# Patient Record
Sex: Male | Born: 1990 | Race: Black or African American | Hispanic: No | Marital: Single | Smoking: Never smoker
Health system: Southern US, Community
[De-identification: ages and names within clinical notes are randomized; demographics above are authoritative.]

## PROBLEM LIST (undated history)

## (undated) DIAGNOSIS — F419 Anxiety disorder, unspecified: Secondary | ICD-10-CM

## (undated) DIAGNOSIS — K219 Gastro-esophageal reflux disease without esophagitis: Secondary | ICD-10-CM

## (undated) DIAGNOSIS — Z789 Other specified health status: Secondary | ICD-10-CM

## (undated) HISTORY — PX: NO PAST SURGERIES: SHX2092

## (undated) HISTORY — DX: Other specified health status: Z78.9

---

## 2020-08-08 ENCOUNTER — Ambulatory Visit: Payer: Self-pay

## 2020-08-08 ENCOUNTER — Ambulatory Visit: Payer: 59 | Attending: Internal Medicine

## 2020-08-08 DIAGNOSIS — Z23 Encounter for immunization: Secondary | ICD-10-CM

## 2020-08-08 NOTE — Progress Notes (Signed)
   Covid-19 Vaccination Clinic  Name:  Terrence Baker    MRN: [id number] DOB: June 13, 1991  08/08/2020  Mr. Terrence Baker was observed post Covid-19 immunization for 15 minutes without incident. He was provided with Vaccine Information Sheet and instruction to access the V-Safe system.   Mr. Terrence Baker was instructed to call 911 with any severe reactions post vaccine: Marland Kitchen Difficulty breathing  . Swelling of face and throat  . A fast heartbeat  . A bad rash all over body  . Dizziness and weakness   Immunizations Administered    Name Date Dose VIS Date Route   Moderna COVID-19 Vaccine 08/08/2020  2:11 PM 0.5 mL 04/23/2020 Intramuscular   Manufacturer: Moderna   Lot: [id number]   NDC: 73428-768-11

## 2020-08-11 ENCOUNTER — Ambulatory Visit (HOSPITAL_COMMUNITY)
Admission: EM | Admit: 2020-08-11 | Discharge: 2020-08-11 | Disposition: A | Discharge status: Home / Self Care | Payer: 59 | Attending: Family Medicine | Admitting: Family Medicine

## 2020-08-11 ENCOUNTER — Other Ambulatory Visit: Payer: Self-pay

## 2020-08-11 ENCOUNTER — Encounter (HOSPITAL_COMMUNITY): Payer: Self-pay

## 2020-08-11 DIAGNOSIS — F411 Generalized anxiety disorder: Secondary | ICD-10-CM | POA: Diagnosis not present

## 2020-08-11 LAB — CBC WITH DIFFERENTIAL/PLATELET
Abs Immature Granulocytes: 0.01 10*3/uL (ref 0.00–0.07)
Basophils Absolute: 0 10*3/uL (ref 0.0–0.1)
Basophils Relative: 0 %
Eosinophils Absolute: 0 10*3/uL (ref 0.0–0.5)
Eosinophils Relative: 1 %
HCT: 48 % (ref 39.0–52.0)
Hemoglobin: 15.4 g/dL (ref 13.0–17.0)
Immature Granulocytes: 0 %
Lymphocytes Relative: 42 %
Lymphs Abs: 2 10*3/uL (ref 0.7–4.0)
MCH: 27.6 pg (ref 26.0–34.0)
MCHC: 32.1 g/dL (ref 30.0–36.0)
MCV: 86.2 fL (ref 80.0–100.0)
Monocytes Absolute: 0.7 10*3/uL (ref 0.1–1.0)
Monocytes Relative: 15 %
Neutro Abs: 2 10*3/uL (ref 1.7–7.7)
Neutrophils Relative %: 42 %
Platelets: 147 10*3/uL — ABNORMAL LOW (ref 150–400)
RBC: 5.57 MIL/uL (ref 4.22–5.81)
RDW: 13.1 % (ref 11.5–15.5)
WBC: 4.7 10*3/uL (ref 4.0–10.5)
nRBC: 0 % (ref 0.0–0.2)

## 2020-08-11 LAB — COMPREHENSIVE METABOLIC PANEL
ALT: 29 U/L (ref 0–44)
AST: 26 U/L (ref 15–41)
Albumin: 4.4 g/dL (ref 3.5–5.0)
Alkaline Phosphatase: 43 U/L (ref 38–126)
Anion gap: 10 (ref 5–15)
BUN: 14 mg/dL (ref 6–20)
CO2: 27 mmol/L (ref 22–32)
Calcium: 9.6 mg/dL (ref 8.9–10.3)
Chloride: 100 mmol/L (ref 98–111)
Creatinine, Ser: 1.16 mg/dL (ref 0.61–1.24)
GFR, Estimated: >60 mL/min (ref >60)
Glucose, Bld: 95 mg/dL (ref 70–99)
Potassium: 4 mmol/L (ref 3.5–5.1)
Sodium: 137 mmol/L (ref 135–145)
Total Bilirubin: 0.4 mg/dL (ref 0.3–1.2)
Total Protein: 7.8 g/dL (ref 6.5–8.1)

## 2020-08-11 LAB — TSH: TSH: 1.78 u[IU]/mL (ref 0.350–4.500)

## 2020-08-11 MED ORDER — HYDROXYZINE HCL 25 MG PO TABS: 25.0000 mg | ORAL_TABLET | Freq: Three times a day (TID) | ORAL | 0 refills | Status: DC | PRN

## 2020-08-11 NOTE — ED Provider Notes (Signed)
[facility]    CSN: [id number] Arrival date & time: 08/11/20  1751      History   Chief Complaint Chief Complaint  Patient presents with  . Panic Attack    HPI Terrence Baker is a 30 y.o. male.   Here today with several months of ongoing intermittent episodes of anxiousness, blurry vision, heavy breathing that he states last about 30 minutes or so and happen every 2 weeks or so. He feels initially this was triggered by COVID/vaccine concerns and that he has a lot of stressors currently. Able to go exercise or do deep breathing and calm himself down with full resolution of sxs. Denies CP, hx of cardiac issues, drug use, known history of psychiatric issues, SI/HI.     History reviewed. No pertinent past medical history.  There are no problems to display for this patient.   History reviewed. No pertinent surgical history.     Home Medications    Prior to Admission medications   Medication Sig Start Date End Date Taking? Authorizing Provider  hydrOXYzine (ATARAX/VISTARIL) 25 MG tablet Take 1 tablet (25 mg total) by mouth every 8 (eight) hours as needed. May cause drowsiness 08/11/20  Yes Particia Nearing, PA-C    Family History Family History  Problem Relation Age of Onset  . Diabetes Father     Social History Social History   Tobacco Use  . Smoking status: Never Smoker  . Smokeless tobacco: Never Used  Vaping Use  . Vaping Use: Never used  Substance Use Topics  . Alcohol use: Never  . Drug use: Never     Allergies   Patient has no known allergies.   Review of Systems Review of Systems PER HPI   Physical Exam Triage Vital Signs ED Triage Vitals  Enc Vitals Group     BP 08/11/20 1859 122/77     Pulse Rate 08/11/20 1859 89     Resp 08/11/20 1859 20     Temp 08/11/20 1859 98.9 F (37.2 C)     Temp Source 08/11/20 1859 Oral     SpO2 08/11/20 1859 100 %     Weight --      Height --      Head Circumference --      Peak Flow --       Pain Score 08/11/20 1855 0     Pain Loc --      Pain Edu? --      Excl. in GC? --    No data found.  Updated Vital Signs BP 122/77 (BP Location: Right Arm)   Pulse 89   Temp 98.9 F (37.2 C) (Oral)   Resp 20   SpO2 100%   Visual Acuity Right Eye Distance:   Left Eye Distance:   Bilateral Distance:    Right Eye Near:   Left Eye Near:    Bilateral Near:     Physical Exam Vitals and nursing note reviewed.  Constitutional:      General: He is not in acute distress.    Appearance: Normal appearance. He is not ill-appearing.  HENT:     Head: Atraumatic.     Mouth/Throat:     Mouth: Mucous membranes are moist.     Pharynx: Oropharynx is clear.  Eyes:     Extraocular Movements: Extraocular movements intact.     Conjunctiva/sclera: Conjunctivae normal.  Cardiovascular:     Rate and Rhythm: Normal rate and regular rhythm.     Heart sounds:  Normal heart sounds.  Pulmonary:     Effort: Pulmonary effort is normal. No respiratory distress.     Breath sounds: Normal breath sounds. No wheezing or rales.  Abdominal:     General: Bowel sounds are normal. There is no distension.     Palpations: Abdomen is soft.     Tenderness: There is no abdominal tenderness. There is no guarding.  Musculoskeletal:        General: Normal range of motion.     Cervical back: Normal range of motion and neck supple.  Skin:    General: Skin is warm and dry.  Neurological:     General: No focal deficit present.     Mental Status: He is oriented to person, place, and time.  Psychiatric:        Mood and Affect: Mood normal.        Thought Content: Thought content normal.        Judgment: Judgment normal.      UC Treatments / Results  Labs (all labs ordered are listed, but only abnormal results are displayed) Labs Reviewed  CBC WITH DIFFERENTIAL/PLATELET  COMPREHENSIVE METABOLIC PANEL  TSH    EKG   Radiology No results found.  Procedures Procedures (including critical care  time)  Medications Ordered in UC Medications - No data to display  Initial Impression / Assessment and Plan / UC Course  I have reviewed the triage vital signs and the nursing notes.  Pertinent labs & imaging results that were available during my care of the patient were reviewed by me and considered in my medical decision making (see chart for details).    Well appearing, vital signs stable, exam benign and reassuring. EKG NSR without abnormality, labs pending. Sxs very consistent with panic episodes - treat with prn hydroxyzine, supportive measures to help calm down, and f/u with a PCP and or counselor for ongoing follow up and care. Strict return precautions reviewed.   Final Clinical Impressions(s) / UC Diagnoses   Final diagnoses:  Anxiety state   Discharge Instructions   None    ED Prescriptions    Medication Sig Dispense Auth. Provider   hydrOXYzine (ATARAX/VISTARIL) 25 MG tablet Take 1 tablet (25 mg total) by mouth every 8 (eight) hours as needed. May cause drowsiness 30 tablet Particia Nearing, [place]     PDMP not reviewed this encounter.   Particia Nearing, [place] 08/11/20 1949

## 2020-08-11 NOTE — ED Triage Notes (Addendum)
Pt c/o intermittent "panic anxiety attacks" for approx 3 months in which he states his heart starts racing, his vision "changes like I'm seeing things and it's blurry".  Last episode yesterday and he called EMS, and reports that EMS evaluated his glucose, vitals and left scene w/o transport.  Denies CP, SOB, n/v, pain to back/jaw/arms, diaphoresis. Pt states he is able to sometimes "calm himself down".  Denies drug or ETOH use. 'Tried CBD" approx 1 year ago, no use since

## 2020-08-22 ENCOUNTER — Other Ambulatory Visit: Payer: Self-pay

## 2020-08-22 ENCOUNTER — Telehealth (INDEPENDENT_AMBULATORY_CARE_PROVIDER_SITE_OTHER): Payer: 59 | Admitting: Family

## 2020-08-22 ENCOUNTER — Encounter: Payer: Self-pay | Admitting: Family

## 2020-08-22 DIAGNOSIS — Z7689 Persons encountering health services in other specified circumstances: Secondary | ICD-10-CM | POA: Diagnosis not present

## 2020-08-22 DIAGNOSIS — F411 Generalized anxiety disorder: Secondary | ICD-10-CM
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
## 2020-08-22 NOTE — Progress Notes (Signed)
Establish care

## 2020-08-28 ENCOUNTER — Other Ambulatory Visit: Payer: Self-pay

## 2020-08-28 ENCOUNTER — Ambulatory Visit: Payer: 59 | Admitting: Licensed Clinical Social Worker

## 2020-08-28 ENCOUNTER — Ambulatory Visit (HOSPITAL_COMMUNITY)
Admission: EM | Admit: 2020-08-28 | Discharge: 2020-08-28 | Disposition: A | Discharge status: Home / Self Care | Payer: 59

## 2020-08-28 DIAGNOSIS — F411 Generalized anxiety disorder: Secondary | ICD-10-CM

## 2020-08-28 DIAGNOSIS — F4323 Adjustment disorder with mixed anxiety and depressed mood: Secondary | ICD-10-CM

## 2020-08-28 DIAGNOSIS — F410 Panic disorder [episodic paroxysmal anxiety] without agoraphobia: Secondary | ICD-10-CM

## 2020-08-28 NOTE — ED Notes (Signed)
Patient A&O x 4, ambulatory. Patient discharged in no acute distress. Patient denied SI/HI, A/VH upon discharge. Patient verbalized understanding of all discharge instructions explained by staff, to include follow up appointments, RX's and safety plan. Patient escorted to lobby via staff for transport to destination. Safety maintained.  

## 2020-08-28 NOTE — BH Specialist Note (Addendum)
Integrated Behavioral Health Initial In-Person Visit  MRN: [id number] Name: Terrence Baker  Number of Integrated Behavioral Health Clinician visits:: 1/6 Session Start time: 2:05  Session End time: 3:25 Total time: 80 minutes  Types of Service: Individual psychotherapy and Collaborative care  Interpretor:No.   Subjective: Terrence Baker is a 30 y.o. male accompanied by Mother over the phone. Patient was referred by Dr. Zonia Kief for anxiety and depression. Patient reports the following symptoms/concerns: having frequent anxiety attacks characterized by heavy breathing, racing thoughts, and feelings of having a heart attack. Pt also reports frequent thoughts of self-harm. Duration of problem: ongoing; Severity of problem: severe  Objective: Mood: Anxious and Depressed and Affect: Appropriate and Depressed Risk of harm to self or others: Suicidal ideation addressed by sending pt to urgent care [facility].  Life Context: Family and Social: pt reports getting support from friends, his mother, and his aunt. 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 Self-Care: pt reports spending time with friends to distract himself. Life Changes: since getting this new job, pt reports heightened stress and the occurrence of panic attacks. Since Valentine's weekend, this has worsened and turned into thoughts of self harm.   Patient Strengths/Protective Factors: Social connections, Social and Emotional competence and Concrete supports in place (healthy food, safe environments, etc.)  Goals Addressed: Patient will: 1. Reduce symptoms of: anxiety, depression and mood instability  though intensive therapy and medication management. 2. Increase knowledge and/or ability of: coping skills and healthy habits through practicing anti-anxiety breathing and learning techniques.  Progress towards Goals: Ongoing  Interventions: Interventions utilized:  Motivational Interviewing, Solution-Focused Strategies and Supportive Counseling   Patient and/or Family Response: pt and mother decided they needed immediate care due to urgency of pt's state, and sought out [facility] services.  Patient Centered Plan: Patient is on the following Treatment Plan(s): get assessed for medication and symptoms at [facility], then establish for therapy and psychiatry.  Assessment: Patient currently experiencing high levels of stress, anxiety, and depression stemming from life changes and past trauma. These stressors have caused the pt to experience thoughts of self-harm and have panic attacks.   Patient may benefit from psychiatry to get on a SSRI or other beneficial medication, as well as establishing with a regular counselor.  Plan: 1. Follow up with behavioral health clinician on : the following week. 2. Behavioral recommendations: seek out urgent care. 3. Referral(s): Paramedic (LME/Outside Clinic), Psychiatrist and Counselor  Aldine Contes MSW Intern

## 2020-08-28 NOTE — Discharge Instructions (Signed)

## 2020-08-28 NOTE — ED Provider Notes (Signed)
Behavioral Health Urgent Care Medical Screening Exam  Patient Name: Terrence Baker MRN: [id number] Date of Evaluation: 08/28/20 Chief Complaint:   Diagnosis:  Final diagnoses:  Generalized anxiety disorder    History of Present illness: Terrence Baker is a 30 y.o. male.  Patient presents voluntarily to [facility] behavioral health center for walk-in assessment.  Patient reports anxiety for many years.  Patient reports most recently in January he accepted a new position within his company and has felt increased anxiety since that time.  Patient reports he went to work today and is currently on his lunch break.  Patient reports he has transition back into to his previous position but continues to feel anxiety when at work.  Patient reports a vague, passive suicidal ideation x2 weeks.  Patient denies any plan or intent to harm self.  Patient denies suicidal ideation currently.  Patient denies any history of suicide attempts, denies any history of self-harm behaviors.  Patient contracts verbally for safety with this Clinical research associate.  Patient denies current outpatient psychiatry follow-up.  Patient reports he recently graduated from [employer].  Patient reports he experienced anxiety while in college and saw therapist during that time.  Patient reports he was recently seen by urgent care and prescribed Vistaril 25 mg 3 times daily as needed for anxiety, patient reports this does seem effective.  Patient reports he is interested in establishing care with outpatient psychiatry and counseling.  Patient resides in [place] with a roommate.  Patient denies access to weapons.  Patient is employed with a Magazine features editor.  Patient endorses 8 hours of sleep per night.  Patient endorses average appetite.  Patient denies alcohol and substance use.  Patient assessed by nurse practitioner.  Patient alert and oriented, pleasant and cooperative during assessment.  Patient denies homicidal  ideations.  Patient denies both auditory and visual hallucinations.  Patient denies symptoms of paranoia.  Patient offered support and encouragement.  TTS counselor also spoke with patient's mother, no safety concerns reported.  Psychiatric Specialty Exam  Presentation  General Appearance:Appropriate for Environment; Casual  Eye Contact:Good  Speech:Clear and Coherent; Normal Rate  Speech Volume:Normal  Handedness:Right   Mood and Affect  Mood:Anxious  Affect:Appropriate; Congruent   Thought Process  Thought Processes:Coherent; Goal Directed  Descriptions of Associations:Intact  Orientation:Full (Time, Place and Person)  Thought Content:Logical; WDL  Hallucinations:None  Ideas of Reference:None  Suicidal Thoughts:No  Homicidal Thoughts:No   Sensorium  Memory:Immediate Good; Recent Good; Remote Good  Judgment:Good  Insight:Good   Executive Functions  Concentration:Good  Attention Span:Good  Recall:Good  Fund of Knowledge:Good  Language:Good   Psychomotor Activity  Psychomotor Activity:Normal   Assets  Assets:Communication Skills; Desire for Improvement; Financial Resources/Insurance; Housing; Intimacy; Leisure Time; Physical Health; Resilience; Social Support; Talents/Skills; Transportation   Sleep  Sleep:Good  Number of hours: No data recorded  Physical Exam: Physical Exam Vitals and nursing note reviewed.  Constitutional:      Appearance: He is well-developed.  HENT:     Head: Normocephalic.  Cardiovascular:     Rate and Rhythm: Normal rate.  Pulmonary:     Effort: Pulmonary effort is normal.  Neurological:     Mental Status: He is alert and oriented to person, place, and time.  Psychiatric:        Attention and Perception: Attention and perception normal.        Mood and Affect: Affect normal. Mood is anxious.        Speech: Speech normal.  Behavior: Behavior normal. Behavior is cooperative.        Thought Content:  Thought content normal.        Cognition and Memory: Cognition and memory normal.        Judgment: Judgment normal.    Review of Systems  Constitutional: Negative.   HENT: Negative.   Eyes: Negative.   Respiratory: Negative.   Cardiovascular: Negative.   Gastrointestinal: Negative.   Genitourinary: Negative.   Musculoskeletal: Negative.   Skin: Negative.   Neurological: Negative.   Endo/Heme/Allergies: Negative.   Psychiatric/Behavioral: The patient is nervous/anxious.    Blood pressure 139/89, pulse (!) 112, temperature 98 F (36.7 C), temperature source Oral, resp. rate 18, height 5\' 9"  (1.753 m), weight 166 lb (75.3 kg), SpO2 98 %. Body mass index is 24.51 kg/m.  Musculoskeletal: Strength & Muscle Tone: within normal limits Gait & Station: normal Patient leans: N/A   [facility] MSE Discharge Disposition for Follow up and Recommendations: Based on my evaluation the patient does not appear to have an emergency medical condition and can be discharged with resources and follow up care in outpatient services for Medication Management and Individual Therapy  Patient reviewed with Dr. . Follow-up with outpatient psychiatry and counseling resources provided.   Bronwen Betters, FNP 08/28/2020, 5:10 PM

## 2020-08-29 ENCOUNTER — Telehealth: Payer: Self-pay | Admitting: Family

## 2020-08-29 NOTE — Telephone Encounter (Signed)
new medication will be reviewed with pt at appt on 09/03/2020

## 2020-08-29 NOTE — Telephone Encounter (Signed)
Pt called requesting to start new medication Lexapro per advice of BH visit yesterday. Pt states he has scheduled appt with psychiatry 10/08/20 & appt with counseling 09/10/20.

## 2020-09-03 ENCOUNTER — Encounter: Payer: 59 | Admitting: Family

## 2020-09-03 NOTE — Progress Notes (Signed)
Patient did not show for appointment.   

## 2020-09-04 ENCOUNTER — Ambulatory Visit: Payer: 59 | Admitting: Licensed Clinical Social Worker

## 2020-09-04 ENCOUNTER — Telehealth: Payer: Self-pay | Admitting: Licensed Clinical Social Worker

## 2020-09-04 NOTE — Telephone Encounter (Signed)
MSW Intern called to speak with pt about upcoming appt and pt requested to reschedule appt with SW services for next week, and to reschedule his missed appt with his provider Ricky Stabs from yesterday. MSW Intern let front desk staff know he wished to reschedule with Amy.

## 2020-09-05 ENCOUNTER — Ambulatory Visit: Payer: Self-pay

## 2020-09-10 ENCOUNTER — Ambulatory Visit (INDEPENDENT_AMBULATORY_CARE_PROVIDER_SITE_OTHER): Payer: No Payment, Other | Admitting: Professional

## 2020-09-10 ENCOUNTER — Other Ambulatory Visit: Payer: Self-pay

## 2020-09-10 DIAGNOSIS — F321 Major depressive disorder, single episode, moderate: Secondary | ICD-10-CM

## 2020-09-10 DIAGNOSIS — F411 Generalized anxiety disorder: Secondary | ICD-10-CM | POA: Diagnosis not present
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
## 2020-09-11 ENCOUNTER — Ambulatory Visit: Payer: Self-pay | Admitting: Licensed Clinical Social Worker

## 2020-09-11 ENCOUNTER — Other Ambulatory Visit: Payer: Self-pay

## 2020-09-11 DIAGNOSIS — F411 Generalized anxiety disorder: Secondary | ICD-10-CM

## 2020-09-11 NOTE — BH Specialist Note (Signed)
MSW intern called pt for scheduled appt to see if he was comfortable in his new established care at [facility]. Pt stated that he had his first therapy appt yesterday and liked it, but would prefer an African American therapist. MSW Intern called [facility] to switch his therapist, but when pt found out that the next available African American therapist appt was at the end of April, pt preferred to keep current upcoming appt, and also attend the upcoming psychiatry appt in early April. Pt was informed that MSW Intern would be leaving agency soon, but said they could be contacted for assistance until then.

## 2020-09-22 ENCOUNTER — Encounter: Payer: 59 | Admitting: Family

## 2020-09-22 NOTE — Progress Notes (Signed)
Patient did not show for appointment.   

## 2020-09-24 ENCOUNTER — Ambulatory Visit (HOSPITAL_COMMUNITY): Payer: No Payment, Other | Admitting: Professional

## 2020-09-24 ENCOUNTER — Other Ambulatory Visit: Payer: Self-pay

## 2020-10-07 ENCOUNTER — Other Ambulatory Visit: Payer: Self-pay

## 2020-10-07 ENCOUNTER — Ambulatory Visit (INDEPENDENT_AMBULATORY_CARE_PROVIDER_SITE_OTHER): Payer: No Payment, Other | Admitting: Professional

## 2020-10-07 DIAGNOSIS — F411 Generalized anxiety disorder: Secondary | ICD-10-CM | POA: Diagnosis not present

## 2020-10-07 DIAGNOSIS — F321 Major depressive disorder, single episode, moderate: Secondary | ICD-10-CM

## 2020-10-07 NOTE — Progress Notes (Signed)
Virtual Visit via Telephone Note  I connected with Terrence Baker on 10/07/20 at  3:00 PM EDT by telephone and verified that I am speaking with the correct person using two identifiers.  Location: Patient: Paramedic (private space) Provider: Clinical Home Office   I discussed the limitations, risks, security and privacy concerns of performing an evaluation and management service by telephone and the availability of in person appointments. I also discussed with the patient that there may be a patient responsible charge related to this service. The patient expressed understanding and agreed to proceed.  Follow Up Instructions:    I discussed the assessment and treatment plan with the patient. The patient was provided an opportunity to ask questions and all were answered. The patient agreed with the plan and demonstrated an understanding of the instructions.   The patient was advised to call back or seek an in-person evaluation if the symptoms worsen or if the condition fails to improve as anticipated.  I provided 45 minutes of non-face-to-face time during this encounter.   [name], [facility]    THERAPIST PROGRESS NOTE  Session Time: 3p  Participation Level: Active  Behavioral Response: CasualAlertAnxious  Type of Therapy: Individual Therapy  Treatment Goals addressed: Coping  Interventions: CBT, DBT, Solution Focused, Strength-based, Supportive and Reframing  Summary: Terrence Baker is a 30 y.o. male who presents with anxiety and depression symptoms. Pt and cln unable to get audio to work on [employer] so switched to phone.  Pt reports via phone due to Caregility not working. Pt reports "It's been going. It was a rough winter." Pt reports "some days I'm up, some days I'm down." Pt reports "I was doing good the last couple of weeks." Pt reports he understands the connection between eating a lot of candy/sugar and increased anxiety/depression for himself. Pt reports he had a panic  attack with heart issues and neck pain yesterday. Pt reports "I'm getting massive brain fog." Cln and pt spend time discussing and playing grounding (5-4-3-2-1; categories game) and PMR. Pt denies SI/HI/AVH  Suicidal/Homicidal: Nowithout intent/plan  Therapist Response: Cln asked how the client has been since last seen. Cln asked open ended questions about positive and/or negative changes that have occurred since last seen. Cln used active listening to understand and validate patient. Cln used CBT to discuss thought stopping and reframing. Cln discussed grounding techniques. Cln assisted with scheduling next appointment.  Plan: Return again in 2 weeks.  Diagnosis: MDD; GAD   [name], [facility] 10/07/2020

## 2020-10-08 ENCOUNTER — Encounter: Payer: Self-pay | Admitting: Family

## 2020-10-08 ENCOUNTER — Other Ambulatory Visit: Payer: Self-pay

## 2020-10-08 ENCOUNTER — Ambulatory Visit (INDEPENDENT_AMBULATORY_CARE_PROVIDER_SITE_OTHER): Payer: Self-pay | Admitting: Family

## 2020-10-08 ENCOUNTER — Telehealth (INDEPENDENT_AMBULATORY_CARE_PROVIDER_SITE_OTHER): Payer: No Payment, Other | Admitting: Psychiatry

## 2020-10-08 ENCOUNTER — Encounter (HOSPITAL_COMMUNITY): Payer: Self-pay | Admitting: Psychiatry

## 2020-10-08 VITALS — BP 114/73 | HR 72 | Ht 68.23 in | Wt 161.6 lb

## 2020-10-08 DIAGNOSIS — R0683 Snoring: Secondary | ICD-10-CM

## 2020-10-08 DIAGNOSIS — Z1329 Encounter for screening for other suspected endocrine disorder: Secondary | ICD-10-CM

## 2020-10-08 DIAGNOSIS — Z13220 Encounter for screening for lipoid disorders: Secondary | ICD-10-CM

## 2020-10-08 DIAGNOSIS — Z130 Encounter for screening for diseases of the blood and blood-forming organs and certain disorders involving the immune mechanism: Secondary | ICD-10-CM

## 2020-10-08 DIAGNOSIS — F332 Major depressive disorder, recurrent severe without psychotic features: Secondary | ICD-10-CM

## 2020-10-08 DIAGNOSIS — Z1159 Encounter for screening for other viral diseases: Secondary | ICD-10-CM

## 2020-10-08 DIAGNOSIS — F321 Major depressive disorder, single episode, moderate: Secondary | ICD-10-CM

## 2020-10-08 DIAGNOSIS — Z131 Encounter for screening for diabetes mellitus: Secondary | ICD-10-CM

## 2020-10-08 DIAGNOSIS — R079 Chest pain, unspecified: Secondary | ICD-10-CM

## 2020-10-08 DIAGNOSIS — Z13228 Encounter for screening for other metabolic disorders: Secondary | ICD-10-CM

## 2020-10-08 DIAGNOSIS — Z0000 Encounter for general adult medical examination without abnormal findings: Secondary | ICD-10-CM

## 2020-10-08 DIAGNOSIS — Z114 Encounter for screening for human immunodeficiency virus [HIV]: Secondary | ICD-10-CM

## 2020-10-08 DIAGNOSIS — F411 Generalized anxiety disorder: Secondary | ICD-10-CM | POA: Diagnosis not present

## 2020-10-08 DIAGNOSIS — K219 Gastro-esophageal reflux disease without esophagitis: Secondary | ICD-10-CM

## 2020-10-08 MED ORDER — OMEPRAZOLE 20 MG PO CPDR: 20.0000 mg | DELAYED_RELEASE_CAPSULE | Freq: Every day | ORAL | 2 refills | Status: DC

## 2020-10-08 MED ORDER — TRAZODONE HCL 50 MG PO TABS: 50.0000 mg | ORAL_TABLET | Freq: Every evening | ORAL | 2 refills | Status: DC | PRN

## 2020-10-08 MED ORDER — FLUOXETINE HCL 10 MG PO CAPS: 10.0000 mg | ORAL_CAPSULE | Freq: Every day | ORAL | 2 refills | Status: DC

## 2020-10-08 MED ORDER — HYDROXYZINE HCL 25 MG PO TABS: 25.0000 mg | ORAL_TABLET | Freq: Three times a day (TID) | ORAL | 0 refills | Status: DC | PRN

## 2020-10-08 NOTE — Patient Instructions (Signed)
Annual physical exam and labs today.   Omeprazole for acid reflux.  Referral to Cardiology.  EKG today.  Follow-up with primary provider as scheduled.  Preventive Care 58-28 Years Old, Male Preventive care refers to lifestyle choices and visits with your health care provider that can promote health and wellness. This includes:  A yearly physical exam. This is also called an annual wellness visit.  Regular dental and eye exams.  Immunizations.  Screening for certain conditions.  Healthy lifestyle choices, such as: ? Eating a healthy diet. ? Getting regular exercise. ? Not using drugs or products that contain nicotine and tobacco. ? Limiting alcohol use. What can I expect for my preventive care visit? Physical exam Your health care provider may check your:  Height and weight. These may be used to calculate your BMI (body mass index). BMI is a measurement that tells if you are at a healthy weight.  Heart rate and blood pressure.  Body temperature.  Skin for abnormal spots. Counseling Your health care provider may ask you questions about your:  Past medical problems.  Family's medical history.  Alcohol, tobacco, and drug use.  Emotional well-being.  Home life and relationship well-being.  Sexual activity.  Diet, exercise, and sleep habits.  Work and work Astronomer.  Access to firearms. What immunizations do I need? Vaccines are usually given at various ages, according to a schedule. Your health care provider will recommend vaccines for you based on your age, medical history, and lifestyle or other factors, such as travel or where you work.   What tests do I need? Blood tests  Lipid and cholesterol levels. These may be checked every 5 years starting at age 41.  Hepatitis C test.  Hepatitis B test. Screening  Diabetes screening. This is done by checking your blood sugar (glucose) after you have not eaten for a while (fasting).  Genital exam to check  for testicular cancer or hernias.  STD (sexually transmitted disease) testing, if you are at risk. Talk with your health care provider about your test results, treatment options, and if necessary, the need for more tests.   Follow these instructions at home: Eating and drinking  Eat a healthy diet that includes fresh fruits and vegetables, whole grains, lean protein, and low-fat dairy products.  Drink enough fluid to keep your urine pale yellow.  Take vitamin and mineral supplements as recommended by your health care provider.  Do not drink alcohol if your health care provider tells you not to drink.  If you drink alcohol: ? Limit how much you have to 0-2 drinks a day. ? Be aware of how much alcohol is in your drink. In the [place] one drink equals one 12 oz bottle of beer (355 mL), one 5 oz glass of wine (148 mL), or one 1 oz glass of hard liquor (44 mL).   Lifestyle  Take daily care of your teeth and gums. Brush your teeth every morning and night with fluoride toothpaste. Floss one time each day.  Stay active. Exercise for at least 30 minutes 5 or more days each week.  Do not use any products that contain nicotine or tobacco, such as cigarettes, e-cigarettes, and chewing tobacco. If you need help quitting, ask your health care provider.  Do not use drugs.  If you are sexually active, practice safe sex. Use a condom or other form of protection to prevent STIs (sexually transmitted infections).  Find healthy ways to cope with stress, such as: ? Meditation, yoga,  or listening to music. ? Journaling. ? Talking to a trusted person. ? Spending time with friends and family. Safety  Always wear your seat belt while driving or riding in a vehicle.  Do not drive: ? If you have been drinking alcohol. Do not ride with someone who has been drinking. ? When you are tired or distracted. ? While texting.  Wear a helmet and other protective equipment during sports activities.  If you  have firearms in your house, make sure you follow all gun safety procedures.  Seek help if you have been physically or sexually abused. What's next?  Go to your health care provider once a year for an annual wellness visit.  Ask your health care provider how often you should have your eyes and teeth checked.  Stay up to date on all vaccines. This information is not intended to replace advice given to you by your health care provider. Make sure you discuss any questions you have with your health care provider. Document Revised: 03/07/2019 Document Reviewed: 06/15/2018 Elsevier Patient Education  2021 [employer].

## 2020-10-08 NOTE — Progress Notes (Signed)
Psychiatric Initial Adult Assessment  Virtual Visit via Video Note  I connected with Terrence Baker on 10/08/20 at  9:00 AM EDT by a video enabled telemedicine application and verified that I am speaking with the correct person using two identifiers.  Location: Patient: Home Provider: Clinic   I discussed the limitations of evaluation and management by telemedicine and the availability of in person appointments. The patient expressed understanding and agreed to proceed.  I provided 45 minutes of non-face-to-face time during this encounter.    Patient Identification: Terrence Baker MRN:  [id number] Date of Evaluation:  10/08/2020 Referral Source: Ricky Stabs, NP Chief Complaint:  "I have brain fog when my anxiety hits" Visit Diagnosis:    ICD-10-CM   1. Severe episode of recurrent major depressive disorder, without psychotic features (HCC)  F33.2 FLUoxetine (PROZAC) 10 MG capsule    traZODone (DESYREL) 50 MG tablet    hydrOXYzine (ATARAX/VISTARIL) 25 MG tablet  2. Generalized anxiety disorder  F41.1 FLUoxetine (PROZAC) 10 MG capsule    traZODone (DESYREL) 50 MG tablet    History of Present Illness:  30 year old male seen today for initial psychiatric evaluation. He was referred to outpatient psychiatry for medication management. He has a psychiatric history of anxiety, depression, and SI. He is currently managed on hydroxyzine 25 mg three times daily. He notes he discontinued hydroxyzine a month ago and have been having up and down anxiety attacks.  Today he is well groomed, pleasant, cooperative, and engaged in conversation. He informed Clinical research associate that He has intrusive negative thoughts that makes him overly anxious. He notes that he has brain fog when his anxiety hits and notes that his panic attacks last 20-30 minutes. He informed Clinical research associate that it is difficult for him to speak and concentrates. Today provider conducted a GAD 7 and patient scored a 19. Patient also notes that he is depressed  most days. He endorses anhedonia, insomnia, feelings of worthlessness, and increased appetite. Today he denies HI/VAH, paranoia, or mania. He does endorse passive SI noting that for the last 3 months he has been having negative thoughts about dying however notes he would not harm himself.  Patient notes that he experienced "Family Trauma" when he was young however did not want to discuss it. Provider endorsed understanding. He denies flashback, nightmares, or avoidant behaviors.  Today he is agreeable to starting Prozac 10 mg to help manage anxiety and depression. He is also agreeable to start Trazodone 50 mg as needed to help manage sleep. Potential side effects of medication and risks vs benefits of treatment vs non-treatment were explained and discussed. All questions were answered. He will follow up with outpatient counseling for therapy. No other concerns noted at this time.      Associated Signs/Symptoms: Depression Symptoms:  depressed mood, anhedonia, insomnia, psychomotor agitation, feelings of worthlessness/guilt, difficulty concentrating, hopelessness, suicidal thoughts without plan, anxiety, panic attacks, increased appetite, (Hypo) Manic Symptoms:  Flight of Ideas, Irritable Mood, Anxiety Symptoms:  Excessive Worry, Panic Symptoms, Psychotic Symptoms:  Denies PTSD Symptoms: Had a traumatic exposure:  Notes he has family trauma  Past Psychiatric History: SI,Anxiety and Depression  Previous Psychotropic Medications: Hydroxyzine  Substance Abuse History in the last 12 months:  No.  Consequences of Substance Abuse: NA  Past Medical History:  Past Medical History:  Diagnosis Date  . No pertinent past medical history     Past Surgical History:  Procedure Laterality Date  . NO PAST SURGERIES      Family Psychiatric History:  Notes that fiather has undiagnosed mental health conditions  Family History:  Family History  Problem Relation Age of Onset  . Diabetes  Father     Social History:   Social History   Socioeconomic History  . Marital status: Single    Spouse name: Not on file  . Number of children: Not on file  . Years of education: Not on file  . Highest education level: Not on file  Occupational History  . Not on file  Tobacco Use  . Smoking status: Never Smoker  . Smokeless tobacco: Never Used  Vaping Use  . Vaping Use: Never used  Substance and Sexual Activity  . Alcohol use: Never  . Drug use: Never  . Sexual activity: Not Currently  Other Topics Concern  . Not on file  Social History Narrative  . Not on file   Social Determinants of Health   Financial Resource Strain: Not on file  Food Insecurity: Not on file  Transportation Needs: Not on file  Physical Activity: Not on file  Stress: Not on file  Social Connections: Not on file    Additional Social History: Patient resides in [place] with his roommate. He is single and has no children. He denies tobacco, alcohol, or illegal drug use. 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 FLUoxetine (PROZAC) 10 MG capsule Take 1 capsule (10 mg total) by mouth daily. 30 capsule 2  . traZODone (DESYREL) 50 MG tablet Take 1 tablet (50 mg total) by mouth at bedtime as needed for sleep. 30 tablet 2  . hydrOXYzine (ATARAX/VISTARIL) 25 MG tablet Take 1 tablet (25 mg total) by mouth every 8 (eight) hours as needed. May cause drowsiness 30 tablet 0   No current facility-administered medications for this visit.     Musculoskeletal: Strength & Muscle Tone: Unable to assess due to telehealth visit Gait & Station: Unable to assess due to telehealth visit Patient leans: N/A  Psychiatric Specialty Exam: Review of Systems  There were no vitals taken for this visit.There is no height or weight on file to calculate BMI.  General Appearance: Well Groomed  Eye Contact:  Good  Speech:  Clear and Coherent and Normal Rate  Volume:  Normal  Mood:  Anxious and Depressed  Affect:  Appropriate and Congruent  Thought Process:  Coherent, Goal Directed and Linear  Orientation:  Full (Time, Place, and Person)  Thought Content:  WDL and Logical  Suicidal Thoughts:  No  Homicidal Thoughts:  No  Memory:  Immediate;   Good Recent;   Good Remote;   Good  Judgement:  Good  Insight:  Good  Psychomotor Activity:  Normal  Concentration:  Concentration: Good and Attention Span: Good  Recall:  Good  Fund of Knowledge:Good  Language: Good  Akathisia:  No  Handed:  Right  AIMS (if indicated):  Not done  Assets:  Communication Skills Desire for Improvement Financial Resources/Insurance Housing Social Support  ADL's:  Intact  Cognition: WNL  Sleep:  Good   Screenings: GAD-7   Haematologist  Visit from 10/08/2020 in [facility]  Total GAD-7 Score 19    PHQ2-9   Flowsheet Row Video Visit from 10/08/2020 in [facility] Counselor from 09/10/2020 in [facility] Telemedicine from 08/22/2020 in Primary Care at [facility] Total Score 4 3 0  PHQ-9 Total Score 23 17 --    Flowsheet Row Video Visit from 10/08/2020 in [facility] Counselor from 09/10/2020 in [facility] from 08/11/2020 in [facility] RISK CATEGORY Error: Q7 should not be populated when Q6 is No Low Risk No Risk      Assessment and Plan: Patient endorses symptoms of  anxiety, depression, and poor sleep. Today he is agreeable to starting Prozac 10 mg  to help manage anxiety and depression. He is also agreeable to start trazodone 50 mg to help manage sleep.  1. Severe episode of recurrent major depressive disorder, without psychotic features (HCC)  Start- FLUoxetine (PROZAC) 10 MG capsule; Take 1 capsule (10 mg total) by mouth daily.  Dispense: 30 capsule; Refill: 2 Start- traZODone (DESYREL) 50 MG tablet; Take 1 tablet (50 mg total) by mouth at bedtime as needed for sleep.  Dispense: 30 tablet; Refill: 2 Start- hydrOXYzine (ATARAX/VISTARIL) 25 MG tablet; Take 1 tablet (25 mg total) by mouth every 8 (eight) hours as needed. May cause drowsiness  Dispense: 30 tablet; Refill: 0  2. Generalized anxiety disorder Start- FLUoxetine (PROZAC) 10 MG capsule; Take 1 capsule (10 mg total) by mouth daily.  Dispense: 30 capsule; Refill: 2 Start- traZODone (DESYREL) 50 MG tablet; Take 1 tablet (50 mg total) by mouth at bedtime as needed for sleep.  Dispense: 30 tablet; Refill: 2  Follow up in 3 months Follow up with therapy  Shanna Cisco, NP 4/6/20229:56 AM

## 2020-10-08 NOTE — Progress Notes (Signed)
Physical Extreme anxiety Acid reflux 2 weeks ago

## 2020-10-08 NOTE — Progress Notes (Signed)
Patient ID: Terrence Baker, male    DOB: 07-Nov-1990  MRN: [id number]  CC: Annual Physical Exam  Subjective: Terrence Baker is a 30 y.o. male who presents for annual physical exam. He is accompanied by his Mother, via cell phone connection, who serves as part-historian.  His concerns today include:   1. ANXIETY FOLLOW-UP: 08/22/2020: - Continue Hydroxyzine as prescribed.  - Referral to Social Work for counseling and community resources.  - Patient provided with contact information to [facility] and accepts walk-ins.  10/08/2020: Reports chest pains with anxiety attacks, requesting EKG. No chest pain in office today. Decreased appetite. Decreased sleep. Awakening during the night with internal shaking. Fatigue. Brain fog. Stuttering has developed recently. Trouble concentrating. Still taking Hydroxyzine as needed. Denies thoughts of self-harm, suicidal ideations, and homicidal ideations.  Had first Psychiatry visit today. Prozac and Trazodone initiated per Psychiatry. Had therapy on yesterday. At those visits discussed coping mechanisms, muscle relaxation, and watching YouTube videos. Follow-up scheduled for next week.  2. ACID REFLUX: Heartburn: yes  Duration: began 2 weeks ago Alleviatiating factors: none Aggravating factors: spicy foods, soda Dysphagia: no Odynophagia:  no Hematemesis: no Blood in stool: no Comments: requesting H. Pylori test  Patient Active Problem List   Diagnosis Date Noted  . Severe episode of recurrent major depressive disorder, without psychotic features (HCC) 10/08/2020  . Generalized anxiety disorder 09/10/2020  . Major depressive disorder, single episode, moderate (HCC) 09/10/2020     Current Outpatient Medications on File Prior to Visit  Medication Sig Dispense Refill  . FLUoxetine (PROZAC) 10 MG capsule Take 1 capsule (10 mg total) by mouth daily. 30 capsule 2  . hydrOXYzine (ATARAX/VISTARIL) 25 MG tablet Take 1  tablet (25 mg total) by mouth every 8 (eight) hours as needed. May cause drowsiness 30 tablet 0  . traZODone (DESYREL) 50 MG tablet Take 1 tablet (50 mg total) by mouth at bedtime as needed for sleep. 30 tablet 2   No current facility-administered medications on file prior to visit.    No Known Allergies  Social History   Socioeconomic History  . Marital status: Single    Spouse name: Not on file  . Number of children: Not on file  . Years of education: Not on file  . Highest education level: Not on file  Occupational History  . Not on file  Tobacco Use  . Smoking status: Never Smoker  . Smokeless tobacco: Never Used  Vaping Use  . Vaping Use: Never used  Substance and Sexual Activity  . Alcohol use: Never  . Drug use: Never  . Sexual activity: Not Currently  Other Topics Concern  . Not on file  Social History Narrative  . Not on file   Social Determinants of Health   Financial Resource Strain: Not on file  Food Insecurity: Not on file  Transportation Needs: Not on file  Physical Activity: Not on file  Stress: Not on file  Social Connections: Not on file  Intimate Partner Violence: Not on file    Family History  Problem Relation Age of Onset  . Diabetes Father     Past Surgical History:  Procedure Laterality Date  . NO PAST SURGERIES      ROS: Review of Systems Negative except as stated above  PHYSICAL EXAM: BP 114/73 (BP Location: Left Arm, Patient Position: Sitting)   Pulse 72   Ht 5' 8.23" (1.733 m)   Wt 161 lb 9.6 oz (73.3 kg)  SpO2 96%   BMI 24.41 kg/m   Physical Exam HENT:     Head: Normocephalic and atraumatic.     Right Ear: Tympanic membrane, ear canal and external ear normal.     Left Ear: Tympanic membrane, ear canal and external ear normal.     Nose: Nose normal.     Mouth/Throat:     Mouth: Mucous membranes are moist.     Pharynx: Oropharynx is clear.  Eyes:     Extraocular Movements: Extraocular movements intact.      Conjunctiva/sclera: Conjunctivae normal.     Pupils: Pupils are equal, round, and reactive to light.  Cardiovascular:     Rate and Rhythm: Normal rate and regular rhythm.     Pulses: Normal pulses.     Heart sounds: Normal heart sounds.  Pulmonary:     Effort: Pulmonary effort is normal.     Breath sounds: Normal breath sounds.  Abdominal:     General: Bowel sounds are normal.     Palpations: Abdomen is soft.  Genitourinary:    Comments: Patient declined examination. Musculoskeletal:        General: Normal range of motion.     Cervical back: Normal range of motion and neck supple.  Skin:    General: Skin is warm and dry.     Capillary Refill: Capillary refill takes less than 2 seconds.  Neurological:     General: No focal deficit present.     Mental Status: He is alert and oriented to person, place, and time.  Psychiatric:        Mood and Affect: Mood is anxious.    ASSESSMENT AND PLAN: 1. Annual physical exam: - Counseled on 150 minutes of exercise per week as tolerated, healthy eating (including decreased daily intake of saturated fats, cholesterol, added sugars, sodium), STI prevention, and routine healthcare maintenance.  2. Screening for metabolic disorder: - CMP last obtained 08/11/2020.  3. Screening for deficiency anemia: - CBC last obtained 08/11/2020.  4. Diabetes mellitus screening: - Hemoglobin A1c to screen for pre-diabetes/diabetes. - Hemoglobin A1c  5. Screening cholesterol level: - Lipid panel to screen for high cholesterol.  - Lipid panel  6. Thyroid disorder screen: - TSH last obtained 08/11/2020.  7. Need for hepatitis C screening test: - Hepatitis C antibody to screen for hepatitis C.  - Hepatitis C Antibody  8. Encounter for screening for HIV: - HIV antibody to screen for human immunodeficiency virus.  - HIV antibody (with reflex)  9. Chest pain, unspecified type: - No chest pain in office today.  - Chest pain may be related to anxiety. - EKG  completed today in office.  - Referral to Cardiology for further evaluation and management. - Ambulatory referral to Cardiology - EKG 12-Lead  10. Generalized anxiety disorder: 11. Severe episode of recurrent major depressive disorder, without psychotic features (HCC): 12. Major depressive disorder, single episode, moderate (HCC): - Stable.  - Denies thoughts of self-harm, suicidal ideations, and homicidal ideations.  - Keep appointments with Behavioral Health as scheduled.   9. Gastroesophageal reflux disease, unspecified whether esophagitis present: You may feel better if you: ?Avoid foods that make your symptoms worse - For some people these include coffee, chocolate, alcohol, peppermint, and fatty foods. ?Stop smoking, if you smoke ?Avoid late meals - Lying down with a full stomach can make reflux worse. Try to plan meals for at least 2 to 3 hours before bedtime. ?Avoid tight clothing - Some people feel better if they wear  comfortable clothing that does not squeeze the stomach area. - Omeprazole as prescribed.  - H Pylori screening per patient request.  - Follow-up with primary provider as scheduled. - H Pylori, IGM, IGG, IGA AB - omeprazole (PRILOSEC) 20 MG capsule; Take 1 capsule (20 mg total) by mouth daily.  Dispense: 30 capsule; Refill: 2   Patient was given the opportunity to ask questions.  Patient verbalized understanding of the plan and was able to repeat key elements of the plan. Patient was given clear instructions to go to Emergency Department or return to medical center if symptoms don't improve, worsen, or new problems develop.The patient verbalized understanding.   Orders Placed This Encounter  Procedures  . Hepatitis C Antibody  . HIV antibody (with reflex)  . Lipid panel  . Hemoglobin A1c  . H Pylori, IGM, IGG, IGA AB  . Ambulatory referral to Cardiology  . EKG 12-Lead  . PSG Sleep Study     Requested Prescriptions   Signed Prescriptions Disp Refills   . omeprazole (PRILOSEC) 20 MG capsule 30 capsule 2    Sig: Take 1 capsule (20 mg total) by mouth daily.    Follow-up with primary provider as scheduled.   Rema Fendt, NP

## 2020-10-09 ENCOUNTER — Telehealth: Payer: Self-pay | Admitting: Family

## 2020-10-09 NOTE — Telephone Encounter (Signed)
Pt called wanting to have a stress test and sleep study done. He states that when he sleeps he has a hard time breathing and would like a referral for his concerns. Pt would also like a copy of his EKG results and I informed pt that he would have to sign a Medical Release Form. Please follow up with patient to let him know what his next steps would be.

## 2020-10-09 NOTE — Telephone Encounter (Signed)
An order for sleep study placed per patient request.   Patient referred to Cardiology during office visit on 10/08/2020. Stress tests are completed by Cardiology.

## 2020-10-10 LAB — H PYLORI, IGM, IGG, IGA AB
H pylori, IgM Abs: <9 units (ref 0.0–8.9)
H. pylori, IgA Abs: <9 units (ref 0.0–8.9)
H. pylori, IgG AbS: 0.35 Index Value (ref 0.00–0.79)

## 2020-10-10 LAB — HEPATITIS C ANTIBODY: Hep C Virus Ab: <0.1 s/co ratio (ref 0.0–0.9)

## 2020-10-10 LAB — LIPID PANEL
Chol/HDL Ratio: 3.1 ratio (ref 0.0–5.0)
Cholesterol, Total: 177 mg/dL (ref 100–199)
HDL: 57 mg/dL (ref >39)
LDL Chol Calc (NIH): 109 mg/dL — ABNORMAL HIGH (ref 0–99)
Triglycerides: 54 mg/dL (ref 0–149)
VLDL Cholesterol Cal: 11 mg/dL (ref 5–40)

## 2020-10-10 LAB — HEMOGLOBIN A1C
Est. average glucose Bld gHb Est-mCnc: 120 mg/dL
Hgb A1c MFr Bld: 5.8 % — ABNORMAL HIGH (ref 4.8–5.6)

## 2020-10-10 LAB — HIV ANTIBODY (ROUTINE TESTING W REFLEX): HIV Screen 4th Generation wRfx: NONREACTIVE

## 2020-10-10 NOTE — Progress Notes (Signed)
H. Pylori negative.   Hepatitis C negative.   HIV negative.   LDL cholesterol, sometimes called "bad cholesterol", higher than expected. High cholesterol may increase risk of heart attack and/or stroke. Consider eating more fruits, vegetables, and lean baked meats such as chicken or fish. Moderate intensity exercise at least 150 minutes as tolerated per week may help as well. Encouraged to recheck in 6 months.   Your hemoglobin A1c is consistent with pre-diabetes. Practice healthy eating habits of fresh fruit and vegetables, lean baked meats such as chicken, fish, and [place]; limit breads, rice, pastas, and desserts; practice regular aerobic exercise (at least 150 minutes a week as tolerated) and will recheck at next visit. Encouraged to recheck in 6 months.

## 2020-10-14 ENCOUNTER — Emergency Department (HOSPITAL_COMMUNITY): Payer: 59

## 2020-10-14 ENCOUNTER — Emergency Department (HOSPITAL_COMMUNITY)
Admission: EM | Admit: 2020-10-14 | Discharge: 2020-10-14 | Disposition: A | Discharge status: Home / Self Care | Payer: 59 | Attending: Emergency Medicine | Admitting: Emergency Medicine

## 2020-10-14 ENCOUNTER — Telehealth (HOSPITAL_COMMUNITY): Payer: Self-pay | Admitting: *Deleted

## 2020-10-14 ENCOUNTER — Encounter (HOSPITAL_COMMUNITY): Payer: Self-pay

## 2020-10-14 ENCOUNTER — Other Ambulatory Visit: Payer: Self-pay

## 2020-10-14 DIAGNOSIS — R531 Weakness: Secondary | ICD-10-CM | POA: Diagnosis not present

## 2020-10-14 DIAGNOSIS — R079 Chest pain, unspecified: Secondary | ICD-10-CM | POA: Insufficient documentation

## 2020-10-14 DIAGNOSIS — R42 Dizziness and giddiness: Secondary | ICD-10-CM | POA: Diagnosis not present

## 2020-10-14 HISTORY — DX: Anxiety disorder, unspecified: F41.9

## 2020-10-14 HISTORY — DX: Gastro-esophageal reflux disease without esophagitis: K21.9

## 2020-10-14 NOTE — ED Provider Notes (Signed)
[facility]-EMERGENCY DEPT Provider Note   CSN: [id number] Arrival date & time: 10/14/20  1716     History Chief Complaint  Patient presents with  . Dizziness  . Chest Pain  . Weakness    Terrence Baker is a 30 y.o. male.  30 yo M with a cc of chest pain.  This is been having him off and on.  Feels like a pinching in his chest.  Typically occurs at rest and then he usually feels a very strong sense of anxiety when it happens.  Today he was at work and sitting at his desk and suddenly felt like he had zoned out a little bit and felt a bit fatigued and then started feeling anxious and felt like he had that pinching sensation.  Still feels like it is there a little bit.  He thinks it could feel like his reflux in the past.  He has not exercised recently but denies any worsening with him being up and moving around.  Has seen his family doctor for symptoms like this in the past and they have recently started him on Prozac and they are sending him to cardiology to see as an outpatient.  He is also on omeprazole for reflux though took it once last Saturday.  Patient denies history of MI, denies hypertension hyperlipidemia diabetes or smoking.  Denies family history of MI.  Patient denies history of PE or DVT denies hemoptysis denies unilateral lower extremity edema denies recent surgery immobilization hospitalization estrogen use or history of cancer.   The history is provided by the patient.  Dizziness Associated symptoms: chest pain and weakness   Associated symptoms: no diarrhea, no headaches, no palpitations, no shortness of breath and no vomiting   Chest Pain Associated symptoms: dizziness and weakness   Associated symptoms: no abdominal pain, no fever, no headache, no palpitations, no shortness of breath and no vomiting   Weakness Associated symptoms: chest pain and dizziness   Associated symptoms: no abdominal pain, no arthralgias, no diarrhea, no fever, no  headaches, no myalgias, no shortness of breath and no vomiting   Illness Severity:  Moderate Onset quality:  Gradual Duration:  2 days Timing:  Intermittent Progression:  Waxing and waning Chronicity:  Recurrent Associated symptoms: chest pain   Associated symptoms: no abdominal pain, no congestion, no diarrhea, no fever, no headaches, no myalgias, no rash, no shortness of breath and no vomiting        Past Medical History:  Diagnosis Date  . Anxiety   . GERD (gastroesophageal reflux disease)   . No pertinent past medical history     Patient Active Problem List   Diagnosis Date Noted  . Severe episode of recurrent major depressive disorder, without psychotic features (HCC) 10/08/2020  . Generalized anxiety disorder 09/10/2020  . Major depressive disorder, single episode, moderate (HCC) 09/10/2020    Past Surgical History:  Procedure Laterality Date  . NO PAST SURGERIES         Family History  Problem Relation Age of Onset  . Diabetes Father     Social History   Tobacco Use  . Smoking status: Never Smoker  . Smokeless tobacco: Never Used  Vaping Use  . Vaping Use: Never used  Substance Use Topics  . Alcohol use: Never  . Drug use: Never    Home Medications Prior to Admission medications   Medication Sig Start Date End Date Taking? Authorizing Provider  FLUoxetine (PROZAC) 10 MG capsule Take 1 capsule (  10 mg total) by mouth daily. 10/08/20   Shanna Cisco, NP  hydrOXYzine (ATARAX/VISTARIL) 25 MG tablet Take 1 tablet (25 mg total) by mouth every 8 (eight) hours as needed. May cause drowsiness 10/08/20   Toy Cookey E, NP  omeprazole (PRILOSEC) 20 MG capsule Take 1 capsule (20 mg total) by mouth daily. 10/08/20   Rema Fendt, NP  traZODone (DESYREL) 50 MG tablet Take 1 tablet (50 mg total) by mouth at bedtime as needed for sleep. 10/08/20   Shanna Cisco, NP    Allergies    Patient has no known allergies.  Review of Systems   Review of  Systems  Constitutional: Negative for chills and fever.  HENT: Negative for congestion and facial swelling.   Eyes: Negative for discharge and visual disturbance.  Respiratory: Negative for shortness of breath.   Cardiovascular: Positive for chest pain. Negative for palpitations.  Gastrointestinal: Negative for abdominal pain, diarrhea and vomiting.  Musculoskeletal: Negative for arthralgias and myalgias.  Skin: Negative for color change and rash.  Neurological: Positive for dizziness and weakness. Negative for tremors, syncope and headaches.  Psychiatric/Behavioral: Negative for confusion and dysphoric mood.    Physical Exam Updated Vital Signs BP 119/81   Pulse (!) 54   Temp 97.6 F (36.4 C) (Oral)   Resp 16   Ht 5' 8.25" (1.734 m)   Wt 72.6 kg   SpO2 100%   BMI 24.15 kg/m   Physical Exam Vitals and nursing note reviewed.  Constitutional:      Appearance: He is well-developed.  HENT:     Head: Normocephalic and atraumatic.  Eyes:     Pupils: Pupils are equal, round, and reactive to light.  Neck:     Vascular: No JVD.  Cardiovascular:     Rate and Rhythm: Normal rate and regular rhythm.     Heart sounds: No murmur heard. No friction rub. No gallop.   Pulmonary:     Effort: No respiratory distress.     Breath sounds: No wheezing.  Abdominal:     General: There is no distension.     Tenderness: There is no guarding or rebound.  Musculoskeletal:        General: Normal range of motion.     Cervical back: Normal range of motion and neck supple.  Skin:    Coloration: Skin is not pale.     Findings: No rash.  Neurological:     Mental Status: He is alert and oriented to person, place, and time.  Psychiatric:        Behavior: Behavior normal.     ED Results / Procedures / Treatments   Labs (all labs ordered are listed, but only abnormal results are displayed) Labs Reviewed - No data to display  EKG None ED ECG REPORT   Date: 10/14/2020  Rate: 69  Rhythm:  normal sinus rhythm  QRS Axis: normal  Intervals: normal  ST/T Wave abnormalities: early repolarization  Conduction Disutrbances:none  Narrative Interpretation:   Old EKG Reviewed: unchanged  I have personally reviewed the EKG tracing and disagree with the computerized printout as noted.      Radiology DG Chest 2 View  Result Date: 10/14/2020 CLINICAL DATA:  Chest pain. EXAM: CHEST - 2 VIEW COMPARISON:  None. FINDINGS: The heart size and mediastinal contours are within normal limits. Both lungs are clear. No pneumothorax or pleural effusion is noted. The visualized skeletal structures are unremarkable. IMPRESSION: No active cardiopulmonary disease. Electronically Signed  By: Lupita Raider M.D.   On: 10/14/2020 18:35    Procedures Procedures   Medications Ordered in ED Medications - No data to display  ED Course  I have reviewed the triage vital signs and the nursing notes.  Pertinent labs & imaging results that were available during my care of the patient were reviewed by me and considered in my medical decision making (see chart for details).    MDM Rules/Calculators/A&P                          30 yo M with a cc of chest pain.  Seems to occur at random.  Not exertional.  Atypical.  ECG without change from prior.   Seems to have a strong underlying component of anxiety has had multiple visits with his family doctor for the same and has recently been started on Prozac.   Feel the patient is extremely low risk for MI.  Will obtain a chest x-ray.  Do not feel that blood work is needed at this time.  We will treat for possible reflux.  Chest x-ray viewed by me without focal showed pneumothorax.  Have him follow-up with his family doctor.  6:50 PM:  I have discussed the diagnosis/risks/treatment options with the patient and believe the pt to be eligible for discharge home to follow-up with PCP. We also discussed returning to the ED immediately if new or worsening sx occur. We  discussed the sx which are most concerning (e.g., sudden worsening pain, fever, inability to tolerate by mouth) that necessitate immediate return. Medications administered to the patient during their visit and any new prescriptions provided to the patient are listed below.  Medications given during this visit Medications - No data to display   The patient appears reasonably screen and/or stabilized for discharge and I doubt any other medical condition or other [facility] requiring further screening, evaluation, or treatment in the ED at this time prior to discharge.   Final Clinical Impression(s) / ED Diagnoses Final diagnoses:  Nonspecific chest pain    Rx / DC Orders ED Discharge Orders    None       Melene Plan, DO 10/14/20 1852

## 2020-10-14 NOTE — Telephone Encounter (Signed)
Patient informed provider that he looked on YouTube regarding side effects of Prozac and notes that he saw suicidal ideation could be one of them.  He notes that he spoke to the nurse at [facility] health and now feels more confident in taking the medication.  He notes that he just started the medication today and is unaware of its effect as of now.  Provider informed patient that it generally takes 4 to 6 weeks before the medications is fully effective.  He endorsed understanding and agreed to continue his medication as prescribed.  No other concerns noted at this time.

## 2020-10-14 NOTE — Discharge Instructions (Signed)
Try pepcid or tagamet up to twice a day.  Try to avoid things that may make this worse, most commonly these are spicy foods tomato based products fatty foods chocolate and peppermint.  Alcohol and tobacco can also make this worse.  Return to the emergency department for sudden worsening pain fever or inability to eat or drink.  

## 2020-10-14 NOTE — ED Triage Notes (Signed)
Patient reports that he was at his job today and began having dizziness, chest discomfort and weakness. EMS was called, but patient decided to have someone else transport him to the ED.

## 2020-10-14 NOTE — Telephone Encounter (Signed)
Call from patient concerned with the potential side affect of Prozac after reading the insert stating the medicine could increase suicidal ideation. Reassured him Prozac was a safe medicine, a medicine a lot is know about as its been around for over 20 years and SI is often a sx of depression and its hard to tease out what is the medicine side effects and what are the risk of the illness. Encouraged to monitor that side effect and if he does have suicidal thoughts to call us or come in to the clinic but not to be overly concerned with that warning vs all the positive benefits of the medicine. Will let provider know he called and his concern.

## 2020-10-21 ENCOUNTER — Ambulatory Visit (INDEPENDENT_AMBULATORY_CARE_PROVIDER_SITE_OTHER): Payer: 59 | Admitting: Professional

## 2020-10-21 ENCOUNTER — Other Ambulatory Visit: Payer: Self-pay

## 2020-10-21 DIAGNOSIS — F411 Generalized anxiety disorder: Secondary | ICD-10-CM

## 2020-10-21 DIAGNOSIS — F332 Major depressive disorder, recurrent severe without psychotic features: Secondary | ICD-10-CM

## 2020-10-21 NOTE — Progress Notes (Signed)
Virtual Visit via Video Note  I connected with Terrence Baker on 10/21/20 at  2:00 PM EDT by a video enabled telemedicine application and verified that I am speaking with the correct person using two identifiers.  Location: Patient: [employer]or, quantity at Work Provider: Chartered certified accountant   I discussed the limitations of evaluation and management by telemedicine and the availability of in person appointments. The patient expressed understanding and agreed to proceed.  Follow Up Instructions:    I discussed the assessment and treatment plan with the patient. The patient was provided an opportunity to ask questions and all were answered. The patient agreed with the plan and demonstrated an understanding of the instructions.   The patient was advised to call back or seek an in-person evaluation if the symptoms worsen or if the condition fails to improve as anticipated.  I provided 54 minutes of non-face-to-face time during this encounter.   [name], [facility]     THERAPIST PROGRESS NOTE  Session Time: 2p  Participation Level: Active  Behavioral Response: CasualAlertAnxious  Type of Therapy: Individual Therapy  Treatment Goals addressed: Coping  Interventions: CBT, DBT, Solution Focused, Strength-based, Supportive and Reframing  Summary: Terrence Baker is a 30 y.o. male who presents with anxiety and depression symptoms. Pt reports "pretty OK. Up and downs." Pt reports he has been doing some grounding and deep breathing techniques (PMR) and they are helping. Pt reports he saw the medication provider and was concerned about starting the medications but has done so at this time. Pt reports he is staying with a friend to help with his depression so "I'm not alone and someone is holding me accountable" for the last 2 weeks. Pt reports he is staying at his own apartment some but "it's triggering because that's where the intrusive thoughts started." Pt reports the intrusive thoughts are  better but he is still stuck on "why?" Pt reports he has not been driving himself at this time due to the anxiety he is experiencing. Cln and pt spend time discussing STOP technique and catastrophizing. Cln spends time explaining the difference between meditation and PMR. Cln and pt spend time discussing how to stop intrusive thoughts (STOP, distractions, TIPPs) and the difference between person and thoughts (not having to go with every thought that comes to mind). Pt denies SI/HI/AVH  Suicidal/Homicidal: Nowithout intent/plan  Therapist Response: Cln asked how the client has been since last seen. Cln asked open ended questions about positive and/or negative changes that have occurred since last seen. Cln used active listening to understand and validate patient. Cln used CBT to discuss thought stopping, cognitive distortions, and reframing. Cln used DBT to discuss STOP and TIPP techniques. Cln discussed grounding, PMR, and mindfulness techniques. Cln assisted with scheduling next appointment.  Plan: Return again in 2 weeks. Pt will follow up with another therapist while this cln is out on maternity leave.  Diagnosis: MDD; GAD   [name], [facility] 10/21/2020

## 2020-10-30 ENCOUNTER — Ambulatory Visit (HOSPITAL_COMMUNITY): Payer: No Payment, Other | Admitting: Clinical

## 2020-10-31 ENCOUNTER — Encounter (HOSPITAL_COMMUNITY): Payer: Self-pay

## 2020-10-31 ENCOUNTER — Emergency Department (HOSPITAL_COMMUNITY)
Admission: EM | Admit: 2020-10-31 | Discharge: 2020-10-31 | Disposition: A | Discharge status: Home / Self Care | Payer: 59 | Attending: Emergency Medicine | Admitting: Emergency Medicine

## 2020-10-31 ENCOUNTER — Other Ambulatory Visit: Payer: Self-pay

## 2020-10-31 DIAGNOSIS — M791 Myalgia, unspecified site: Secondary | ICD-10-CM | POA: Diagnosis not present

## 2020-10-31 DIAGNOSIS — Z20822 Contact with and (suspected) exposure to covid-19: Secondary | ICD-10-CM | POA: Insufficient documentation

## 2020-10-31 DIAGNOSIS — R52 Pain, unspecified: Secondary | ICD-10-CM

## 2020-10-31 LAB — BASIC METABOLIC PANEL
Anion gap: 8 (ref 5–15)
BUN: 17 mg/dL (ref 6–20)
CO2: 24 mmol/L (ref 22–32)
Calcium: 9.6 mg/dL (ref 8.9–10.3)
Chloride: 105 mmol/L (ref 98–111)
Creatinine, Ser: 1.01 mg/dL (ref 0.61–1.24)
GFR, Estimated: >60 mL/min (ref >60)
Glucose, Bld: 94 mg/dL (ref 70–99)
Potassium: 4 mmol/L (ref 3.5–5.1)
Sodium: 137 mmol/L (ref 135–145)

## 2020-10-31 LAB — CBC WITH DIFFERENTIAL/PLATELET
Abs Immature Granulocytes: 0.01 10*3/uL (ref 0.00–0.07)
Basophils Absolute: 0 10*3/uL (ref 0.0–0.1)
Basophils Relative: 0 %
Eosinophils Absolute: 0.1 10*3/uL (ref 0.0–0.5)
Eosinophils Relative: 1 %
HCT: 43.2 % (ref 39.0–52.0)
Hemoglobin: 14.2 g/dL (ref 13.0–17.0)
Immature Granulocytes: 0 %
Lymphocytes Relative: 44 %
Lymphs Abs: 2.2 10*3/uL (ref 0.7–4.0)
MCH: 28.8 pg (ref 26.0–34.0)
MCHC: 32.9 g/dL (ref 30.0–36.0)
MCV: 87.6 fL (ref 80.0–100.0)
Monocytes Absolute: 0.5 10*3/uL (ref 0.1–1.0)
Monocytes Relative: 11 %
Neutro Abs: 2.2 10*3/uL (ref 1.7–7.7)
Neutrophils Relative %: 44 %
Platelets: 141 10*3/uL — ABNORMAL LOW (ref 150–400)
RBC: 4.93 MIL/uL (ref 4.22–5.81)
RDW: 13.7 % (ref 11.5–15.5)
WBC: 4.9 10*3/uL (ref 4.0–10.5)
nRBC: 0 % (ref 0.0–0.2)

## 2020-10-31 LAB — RESP PANEL BY RT-PCR (FLU A&B, COVID) ARPGX2
Influenza A by PCR: NEGATIVE
Influenza B by PCR: NEGATIVE
SARS Coronavirus 2 by RT PCR: NEGATIVE

## 2020-10-31 LAB — CK: Total CK: 207 U/L (ref 49–397)

## 2020-10-31 MED ORDER — IBUPROFEN 200 MG PO TABS
600.0000 mg | ORAL_TABLET | Freq: Once | ORAL | Status: AC
  Administered 2020-10-31: 600 mg via ORAL
  Filled 2020-10-31: qty 3

## 2020-10-31 NOTE — ED Triage Notes (Signed)
Emergency Medicine Provider Triage Evaluation Note  Terrence Baker , a 29 y.o. male  was evaluated in triage.  Pt complains of presents with just not feeling right, has generalized body pain, started yesterday, he states he just done a full body workout, denies urinary symptoms, denies fevers or chills no recent sick contacts..  Review of Systems  Positive: Body aches, scratchy throat Negative: Denies urinary symptoms, chest pain shortness of breath  Physical Exam  BP 111/73 (BP Location: Left Arm)   Pulse 64   Temp 98 F (36.7 C) (Oral)   Resp 18   SpO2 100%  Gen:   Awake, no distress   HEENT:  Atraumatic  Resp:  Normal effort  Cardiac:  Normal rate  MSK:   Moves extremities without difficulty  Neuro:  Speech clear   Medical Decision Making  Medically screening exam initiated at 2:54 PM.  Appropriate orders placed.  Terrence Baker was informed that the remainder of the evaluation will be completed by another provider, this initial triage assessment does not replace that evaluation, and the importance of remaining in the ED until their evaluation is complete.  Clinical Impression  Full body aches sore throat, lab work has been ordered, patient need further work-up in the emergency department.   Carroll Sage, PA-C 10/31/20 1455

## 2020-10-31 NOTE — ED Triage Notes (Signed)
Pt arrived via walk in, c/o body aches, fatigue sore throat. Denies any known sick contacts.

## 2020-10-31 NOTE — ED Provider Notes (Signed)
[facility]-EMERGENCY DEPT Provider Note   CSN: [id number] Arrival date & time: 10/31/20  1357     History Chief Complaint  Patient presents with  . Generalized Body Aches    Terrence Baker is a 30 y.o. male with past medical history significant for anxiety and GERD.  HPI Patient presents to emergency room today with chief complaint of generalized body aches x1 day.  He states when he woke up this morning he first noticed them.  He admits to working out yesterday.  He did a full body workout and was not sure if that is the cause of his pain. He describes body aches as a soreness, rates pain 2/10 in severity. He did not take any OTC medications prior to arrival. He denies any history of IVDA.  Denies any fever or chills, neck swelling, back pain.  No sick contacts or known COVID exposures.   Past Medical History:  Diagnosis Date  . Anxiety   . GERD (gastroesophageal reflux disease)   . No pertinent past medical history     Patient Active Problem List   Diagnosis Date Noted  . Severe episode of recurrent major depressive disorder, without psychotic features (HCC) 10/08/2020  . Generalized anxiety disorder 09/10/2020  . Major depressive disorder, single episode, moderate (HCC) 09/10/2020    Past Surgical History:  Procedure Laterality Date  . NO PAST SURGERIES         Family History  Problem Relation Age of Onset  . Diabetes Father     Social History   Tobacco Use  . Smoking status: Never Smoker  . Smokeless tobacco: Never Used  Vaping Use  . Vaping Use: Never used  Substance Use Topics  . Alcohol use: Never  . Drug use: Never    Home Medications Prior to Admission medications   Medication Sig Start Date End Date Taking? Authorizing Provider  FLUoxetine (PROZAC) 10 MG capsule Take 1 capsule (10 mg total) by mouth daily. 10/08/20   Shanna Cisco, NP  hydrOXYzine (ATARAX/VISTARIL) 25 MG tablet Take 1 tablet (25 mg total) by mouth every 8  (eight) hours as needed. May cause drowsiness 10/08/20   Toy Cookey E, NP  omeprazole (PRILOSEC) 20 MG capsule Take 1 capsule (20 mg total) by mouth daily. 10/08/20   Rema Fendt, NP  traZODone (DESYREL) 50 MG tablet Take 1 tablet (50 mg total) by mouth at bedtime as needed for sleep. 10/08/20   Shanna Cisco, NP    Allergies    Patient has no known allergies.  Review of Systems   Review of Systems All other systems are reviewed and are negative for acute change except as noted in the HPI.  Physical Exam Updated Vital Signs BP 116/78   Pulse (!) 58   Temp 98 F (36.7 C) (Oral)   Resp 17   SpO2 99%   Physical Exam Vitals and nursing note reviewed.  Constitutional:      General: He is not in acute distress.    Appearance: He is not ill-appearing.  HENT:     Head: Normocephalic and atraumatic.     Right Ear: Tympanic membrane and external ear normal.     Left Ear: Tympanic membrane and external ear normal.     Nose: Nose normal.     Mouth/Throat:     Mouth: Mucous membranes are moist.     Pharynx: Oropharynx is clear. No oropharyngeal exudate or posterior oropharyngeal erythema.  Eyes:  General: No scleral icterus.       Right eye: No discharge.        Left eye: No discharge.     Extraocular Movements: Extraocular movements intact.     Conjunctiva/sclera: Conjunctivae normal.     Pupils: Pupils are equal, round, and reactive to light.  Neck:     Vascular: No JVD.  Cardiovascular:     Rate and Rhythm: Normal rate and regular rhythm.     Pulses: Normal pulses.          Radial pulses are 2+ on the right side and 2+ on the left side.     Heart sounds: Normal heart sounds.  Pulmonary:     Comments: Lungs clear to auscultation in all fields. Symmetric chest rise. No wheezing, rales, or rhonchi. Chest:     Chest wall: No tenderness.  Abdominal:     Comments: Abdomen is soft, non-distended, and non-tender in all quadrants. No rigidity, no guarding. No  peritoneal signs.  Musculoskeletal:        General: No swelling. Normal range of motion.     Cervical back: Normal range of motion.     Comments: Compartments in all extremities are soft.  Skin:    General: Skin is warm and dry.     Capillary Refill: Capillary refill takes less than 2 seconds.     Findings: No rash.  Neurological:     Mental Status: He is oriented to person, place, and time.     GCS: GCS eye subscore is 4. GCS verbal subscore is 5. GCS motor subscore is 6.     Comments: Fluent speech, no facial droop.  Psychiatric:        Behavior: Behavior normal.     ED Results / Procedures / Treatments   Labs (all labs ordered are listed, but only abnormal results are displayed) Labs Reviewed  CBC WITH DIFFERENTIAL/PLATELET - Abnormal; Notable for the following components:      Result Value   Platelets 141 (*)    All other components within normal limits  RESP PANEL BY RT-PCR (FLU A&B, COVID) ARPGX2  CK  BASIC METABOLIC PANEL    EKG None  Radiology No results found.  Procedures Procedures   Medications Ordered in ED Medications  ibuprofen (ADVIL) tablet 600 mg (600 mg Oral Given 10/31/20 1950)    ED Course  I have reviewed the triage vital signs and the nursing notes.  Pertinent labs & imaging results that were available during my care of the patient were reviewed by me and considered in my medical decision making (see chart for details).    MDM Rules/Calculators/A&P                          History provided by patient with additional history obtained from chart review.    Patient presents with body aches. Patient is nontoxic appearing, in no apparent distress, vitals are WNL. Patient is afebrile in the ED, lungs are CTA.  No history components or rashes to raise concern for tic borne illness.  Labs were collected in triage.  CBC shows platelet count of 141, has history of the same.  No leukocytosis or anemia.  BMP unremarkable.  CK is normal at 207.  COVID  and flu are negative.  Doubt rhabdomyolysis given normal CK.  Patient could have muscle soreness from recent workout or possible viral illness.  No indications for further emergent work-up at this time.  Discussed this with patient and his mother.  Motrin given for pain.  Recommend he follow-up with PCP for thrombocytopenia.  Discussed symptomatic care at home with Tylenol and Motrin and increasing fluid intake.  I discussed results, treatment plan, need for PCP follow-up, and return precautions with the patient. Provided opportunity for questions, patient confirmed understanding and is in agreement with plan.    Portions of this note were generated with Scientist, clinical (histocompatibility and immunogenetics). Dictation errors may occur despite best attempts at proofreading.    Final Clinical Impression(s) / ED Diagnoses Final diagnoses:  Body aches    Rx / DC Orders ED Discharge Orders    None       Kandice Hams 10/31/20 2004    Charlynne Pander, MD 10/31/20 2328

## 2020-10-31 NOTE — Discharge Instructions (Addendum)
Your blood work today did not have concerning findings.  Your platelet count was slightly low at 141, while the normal number should be 150.   You can follow-up with your primary care doctor about this.  It is possible this is your normal.  COVID and flu test are negative.  Stay well-hydrated this weekend.  Return to ER for new or worsening symptoms.

## 2020-11-04 ENCOUNTER — Ambulatory Visit (INDEPENDENT_AMBULATORY_CARE_PROVIDER_SITE_OTHER): Payer: 59 | Admitting: Professional

## 2020-11-04 ENCOUNTER — Other Ambulatory Visit: Payer: Self-pay

## 2020-11-04 DIAGNOSIS — F321 Major depressive disorder, single episode, moderate: Secondary | ICD-10-CM

## 2020-11-04 DIAGNOSIS — F411 Generalized anxiety disorder: Secondary | ICD-10-CM | POA: Diagnosis not present

## 2020-11-04 NOTE — Progress Notes (Signed)
Virtual Visit via Video Note  I connected with Terrence Baker on 11/04/20 at  3:00 PM EDT by a video enabled telemedicine application and verified that I am speaking with the correct person using two identifiers.  Location: Patient: Surveyor, quantity at Work Provider: Chartered certified accountant   I discussed the limitations of evaluation and management by telemedicine and the availability of in person appointments. The patient expressed understanding and agreed to proceed.  Follow Up Instructions:    I discussed the assessment and treatment plan with the patient. The patient was provided an opportunity to ask questions and all were answered. The patient agreed with the plan and demonstrated an understanding of the instructions.   The patient was advised to call back or seek an in-person evaluation if the symptoms worsen or if the condition fails to improve as anticipated.  I provided 45 minutes of non-face-to-face time during this encounter.   [name], [facility]     THERAPIST PROGRESS NOTE  Session Time: 3p  Participation Level: Active  Behavioral Response: CasualAlertAnxious  Type of Therapy: Individual Therapy  Treatment Goals addressed: Coping  Interventions: CBT, DBT, Solution Focused, Strength-based, Supportive and Reframing  Summary: Terrence Baker is a 30 y.o. male who presents with anxiety and depression symptoms. Pt reports things are "OK." Pt reports he is trying to get better at working on the techniques we have discussed in therapy. Pt reports he is working on 5-4-3-2-1 and meditation techniques most. Pt reports his ADHD has been bad; anxiety has been decreased. Cln and pt spend time discussing overthinking and helping with attention span. Pt reports he traveled outside of [place] over the weekend and felt good about doing so- travel anxiety has been bad for the last few months. Pt reports driving anxiety, especially on highway. Pt reports he didn't get his license until  30 years old due to the anxiety. Cln and pt spend time discussing things pt can do to help with anxiety while in a car (listening to music, plan ahead, talk to someone in the car, nap, games on phone). Pt reports his anxiety has held him back in life and he is working on moving forward so he isn't held back. Cln and pt spend time discussing this. Pt reports he feels like he masturbates too much and it "messes with my psyche. I don't want to look at a woman as an object or looking at a woman differently." Cln and pt spend time discussing other options to feel a release (working out, being around "certain people/friends," going out in public where pt wont have desire to masturbate). Cln reports he is not sexually active and wants to get back to not focusing on sexual thoughts. Pt denies SI/HI/AVH    Suicidal/Homicidal: Nowithout intent/plan  Therapist Response: Cln asked how the client has been since last seen. Cln asked open ended questions about positive and/or negative changes that have occurred since last seen. Cln used active listening to understand and validate patient. Cln used CBT to discuss thought stopping, cognitive distortions, and reframing. Cln used DBT to discuss STOP  technique. Cln discussed grounding and mindfulness techniques. Cln assisted with scheduling next appointment.  Plan: Pt is scheduled to follow up with Paige Cozart while this cln is out.  Diagnosis: MDD; GAD   [name], [facility] 11/04/2020

## 2020-11-04 NOTE — Progress Notes (Signed)
Cardiology Office Note:    Date:  11/05/2020   ID:  Terrence Baker, DOB 1990-10-13, MRN [id number]  PCP:  Camillia Herter, NP  Cardiologist:  None  Electrophysiologist:  None   Referring MD: Camillia Herter, NP   Chief Complaint  Patient presents with  . New Patient (Initial Visit)  . Chest Pain    History of Present Illness:    Terrence Baker is a 30 y.o. male with a hx of anxiety who is referred by Gypsy Balsam, NP for evaluation of chest pain.  He reports that his chest pain started months ago.  Describes as left-sided burning pain, but can also sometimes be a pinching pain.  Typically occurs when he is anxious and during panic attacks.  Has not traditionally noticed it with exertion but reports last week was doing high intensity workout and started having burning sensation in his chest.  Also reports he has been waking up short of breath.  Has been referred for sleep study.  He denies any lightheadedness, syncope, or lower extremity edema.  Does report that he has been having palpitations where he feels like heart is racing, typically occurs during panic attacks.  Has been having about once per week.  No smoking history.  Family history includes maternal grandmother has mitral valve prolapse and CHF and his 85 year old sister was recently referred to cardiologist for chest pain evaluation.   Past Medical History:  Diagnosis Date  . Anxiety   . GERD (gastroesophageal reflux disease)   . No pertinent past medical history     Past Surgical History:  Procedure Laterality Date  . NO PAST SURGERIES      Current Medications: Current Meds  Medication Sig  . FLUoxetine (PROZAC) 10 MG capsule Take 1 capsule (10 mg total) by mouth daily.  . hydrOXYzine (ATARAX/VISTARIL) 25 MG tablet Take 1 tablet (25 mg total) by mouth every 8 (eight) hours as needed. May cause drowsiness  . omeprazole (PRILOSEC) 20 MG capsule Take 1 capsule (20 mg total) by mouth daily.  . traZODone (DESYREL) 50 MG tablet  Take 1 tablet (50 mg total) by mouth at bedtime as needed for sleep.     Allergies:   Patient has no known allergies.   Social History   Socioeconomic History  . Marital status: Single    Spouse name: Not on file  . Number of children: Not on file  . Years of education: Not on file  . Highest education level: Not on file  Occupational History  . Not on file  Tobacco Use  . Smoking status: Never Smoker  . Smokeless tobacco: Never Used  Vaping Use  . Vaping Use: Never used  Substance and Sexual Activity  . Alcohol use: Never  . Drug use: Never  . Sexual activity: Not Currently  Other Topics Concern  . Not on file  Social History Narrative  . Not on file   Social Determinants of Health   Financial Resource Strain: Not on file  Food Insecurity: Not on file  Transportation Needs: Not on file  Physical Activity: Not on file  Stress: Not on file  Social Connections: Not on file     Family History: The patient's family history includes Diabetes in his father.  ROS:   Please see the history of present illness.    All other systems reviewed and are negative.  EKGs/Labs/Other Studies Reviewed:    The following studies were reviewed today:   EKG:  EKG is  ordered  today.  The ekg ordered today demonstrates normal sinus rhythm, rate 67, diffuse concave ST elevations consistent with early repolarization  Recent Labs: 08/11/2020: ALT 29; TSH 1.780 10/31/2020: BUN 17; Creatinine, Ser 1.01; Hemoglobin 14.2; Platelets 141; Potassium 4.0; Sodium 137  Recent Lipid Panel    Component Value Date/Time   CHOL 177 10/08/2020 1703   TRIG 54 10/08/2020 1703   HDL 57 10/08/2020 1703   CHOLHDL 3.1 10/08/2020 1703   LDLCALC 109 (H) 10/08/2020 1703    Physical Exam:    VS:  BP 118/64 (BP Location: Left Arm, Patient Position: Sitting, Cuff Size: Normal)   Pulse 67   Ht 5' 8"  (1.727 m)   Wt 158 lb (71.7 kg)   BMI 24.02 kg/m     Wt Readings from Last 3 Encounters:  11/05/20 158  lb (71.7 kg)  10/14/20 160 lb (72.6 kg)  10/08/20 161 lb 9.6 oz (73.3 kg)     GEN:  Well nourished, well developed in no acute distress HEENT: Normal NECK: No JVD; No carotid bruits LYMPHATICS: No lymphadenopathy CARDIAC: RRR, no murmurs, rubs, gallops RESPIRATORY:  Clear to auscultation without rales, wheezing or rhonchi  ABDOMEN: Soft, non-tender, non-distended MUSCULOSKELETAL:  No edema; No deformity  SKIN: Warm and dry NEUROLOGIC:  Alert and oriented x 3 PSYCHIATRIC:  Normal affect   ASSESSMENT:    1. Chest pain of uncertain etiology   2. Palpitations    PLAN:    Chest pain: Atypical in description, suspect due to anxiety/panic attacks.  However did report recent episode with exertion.  Will check ETT to rule out ischemia.  Does have baseline mild concave ST elevations on EKG, consistent with early repolarization.  Description of pain is not consistent with pericarditis.  Will check ESR/CRP.  Palpitations: Suspect due to panic attacks.  Will check Zio patch x2 weeks to rule out arrhythmia.  RTC in 3 months  Shared Decision Making/Informed Consent The risks [chest pain, shortness of breath, cardiac arrhythmias, dizziness, blood pressure fluctuations, myocardial infarction, stroke/transient ischemic attack, and life-threatening complications (estimated to be 1 in 10,000)], benefits (risk stratification, diagnosing coronary artery disease, treatment guidance) and alternatives of an exercise tolerance test were discussed in detail with Mr. Chaikin and he agrees to proceed.      Medication Adjustments/Labs and Tests Ordered: Current medicines are reviewed at length with the patient today.  Concerns regarding medicines are outlined above.  Orders Placed This Encounter  Procedures  . C-reactive protein  . Sedimentation rate  . EXERCISE TOLERANCE TEST (ETT)  . LONG TERM MONITOR (3-14 DAYS)  . EKG 12-Lead   No orders of the defined types were placed in this  encounter.   Patient Instructions  Medication Instructions:  Your physician recommends that you continue on your current medications as directed. Please refer to the Current Medication list given to you today.  *If you need a refill on your cardiac medications before your next appointment, please call your pharmacy*   Lab Work: ESR, CRP  If you have labs (blood work) drawn today and your tests are completely normal, you will receive your results only by: Marland Kitchen MyChart Message (if you have MyChart) OR . A paper copy in the mail If you have any lab test that is abnormal or we need to change your treatment, we will call you to review the results.   Testing/Procedures: Your physician has requested that you have an exercise tolerance test. For further information please visit [email or web address]. Please also follow instruction sheet,  as given.   ZIO XT- Long Term Monitor Instructions   Your physician has requested you wear your ZIO patch monitor___14___days.   This is a single patch monitor.  [employer] supplies one patch monitor per enrollment.  Additional stickers are not available.   Please do not apply patch if you will be having a Nuclear Stress Test, Echocardiogram, Cardiac CT, MRI, or Chest Xray during the time frame you would be wearing the monitor. The patch cannot be worn during these tests.  You cannot remove and re-apply the ZIO XT patch monitor.   Your ZIO patch monitor will be sent USPS Priority mail from [facility] directly to your home address. The monitor may also be mailed to a PO BOX if home delivery is not available.   It may take 3-5 days to receive your monitor after you have been enrolled.   Once you have received you monitor, please review enclosed instructions.  Your monitor has already been registered assigning a specific monitor serial # to you.   Applying the monitor   Shave hair from upper left chest.   Hold abrader disc by orange tab.  Rub abrader in  40 strokes over left upper chest as indicated in your monitor instructions.   Clean area with 4 enclosed alcohol pads .  Use all pads to assure are is cleaned thoroughly.  Let dry.   Apply patch as indicated in monitor instructions.  Patch will be place under collarbone on left side of chest with arrow pointing upward.   Rub patch adhesive wings for 2 minutes.Remove white label marked "1".  Remove white label marked "2".  Rub patch adhesive wings for 2 additional minutes.   While looking in a mirror, press and release button in center of patch.  A small green light will flash 3-4 times .  This will be your only indicator the monitor has been turned on.     Do not shower for the first 24 hours.  You may shower after the first 24 hours.   Press button if you feel a symptom. You will hear a small click.  Record Date, Time and Symptom in the Patient Log Book.   When you are ready to remove patch, follow instructions on last 2 pages of Patient Log Book.  Stick patch monitor onto last page of Patient Log Book.   Place Patient Log Book in [place] box.  Use locking tab on box and tape box closed securely.  The [employer] and [employer] has [employer] on it.  Please place in mailbox as soon as possible.  Your physician should have your test results approximately 7 days after the monitor has been mailed back to [facility].   Call [place] at [phone] if you have questions regarding your ZIO XT patch monitor.  Call them immediately if you see an orange light blinking on your monitor.   If your monitor falls off in less than 4 days contact our Monitor department at [phone].  If your monitor becomes loose or falls off after 4 days call [employer] at [phone] for suggestions on securing your monitor.    Follow-Up: At [facility], you and your health needs are our priority.  As part of our continuing mission to provide you with exceptional heart care, we have created  designated Provider Care Teams.  These Care Teams include your primary Cardiologist (physician) and Advanced Practice Providers (APPs -  Physician Assistants and Nurse Practitioners) who all work together to  provide you with the care you need, when you need it.  We recommend signing up for the patient portal called "MyChart".  Sign up information is provided on this After Visit Summary.  MyChart is used to connect with patients for Virtual Visits (Telemedicine).  Patients are able to view lab/test results, encounter notes, upcoming appointments, etc.  Non-urgent messages can be sent to your provider as well.   To learn more about what you can do with MyChart, go to [email or web address].    Your next appointment:   3 month(s)  The format for your next appointment:   In Person  Provider:   Oswaldo Milian, MD        Signed, Donato Heinz, MD  11/05/2020 4:34 PM    [place]

## 2020-11-05 ENCOUNTER — Other Ambulatory Visit: Payer: Self-pay

## 2020-11-05 ENCOUNTER — Ambulatory Visit: Payer: 59

## 2020-11-05 ENCOUNTER — Encounter: Payer: Self-pay | Admitting: Cardiology

## 2020-11-05 ENCOUNTER — Ambulatory Visit (INDEPENDENT_AMBULATORY_CARE_PROVIDER_SITE_OTHER): Payer: 59 | Admitting: Cardiology

## 2020-11-05 VITALS — BP 118/64 | HR 67 | Ht 68.0 in | Wt 158.0 lb

## 2020-11-05 DIAGNOSIS — R079 Chest pain, unspecified: Secondary | ICD-10-CM

## 2020-11-05 DIAGNOSIS — R002 Palpitations: Secondary | ICD-10-CM

## 2020-11-05 NOTE — Patient Instructions (Signed)
Medication Instructions:  Your physician recommends that you continue on your current medications as directed. Please refer to the Current Medication list given to you today.  *If you need a refill on your cardiac medications before your next appointment, please call your pharmacy*   Lab Work: ESR, CRP  If you have labs (blood work) drawn today and your tests are completely normal, you will receive your results only by: Marland Kitchen MyChart Message (if you have MyChart) OR . A paper copy in the mail If you have any lab test that is abnormal or we need to change your treatment, we will call you to review the results.   Testing/Procedures: Your physician has requested that you have an exercise tolerance test. For further information please visit [email or web address]. Please also follow instruction sheet, as given.   ZIO XT- Long Term Monitor Instructions   Your physician has requested you wear your ZIO patch monitor___14___days.   This is a single patch monitor.  [employer] supplies one patch monitor per enrollment.  Additional stickers are not available.   Please do not apply patch if you will be having a Nuclear Stress Test, Echocardiogram, Cardiac CT, MRI, or Chest Xray during the time frame you would be wearing the monitor. The patch cannot be worn during these tests.  You cannot remove and re-apply the ZIO XT patch monitor.   Your ZIO patch monitor will be sent USPS Priority mail from [facility] directly to your home address. The monitor may also be mailed to a PO BOX if home delivery is not available.   It may take 3-5 days to receive your monitor after you have been enrolled.   Once you have received you monitor, please review enclosed instructions.  Your monitor has already been registered assigning a specific monitor serial # to you.   Applying the monitor   Shave hair from upper left chest.   Hold abrader disc by orange tab.  Rub abrader in 40 strokes over left upper chest as  indicated in your monitor instructions.   Clean area with 4 enclosed alcohol pads .  Use all pads to assure are is cleaned thoroughly.  Let dry.   Apply patch as indicated in monitor instructions.  Patch will be place under collarbone on left side of chest with arrow pointing upward.   Rub patch adhesive wings for 2 minutes.Remove white label marked "1".  Remove white label marked "2".  Rub patch adhesive wings for 2 additional minutes.   While looking in a mirror, press and release button in center of patch.  A small green light will flash 3-4 times .  This will be your only indicator the monitor has been turned on.     Do not shower for the first 24 hours.  You may shower after the first 24 hours.   Press button if you feel a symptom. You will hear a small click.  Record Date, Time and Symptom in the Patient Log Book.   When you are ready to remove patch, follow instructions on last 2 pages of Patient Log Book.  Stick patch monitor onto last page of Patient Log Book.   Place Patient Log Book in [place] box.  Use locking tab on box and tape box closed securely.  The [employer] has [employer] on it.  Please place in mailbox as soon as possible.  Your physician should have your test results approximately 7 days after the monitor has been mailed back to [facility].  Call [place] at [phone] if you have questions regarding your ZIO XT patch monitor.  Call them immediately if you see an orange light blinking on your monitor.   If your monitor falls off in less than 4 days contact our Monitor department at [phone].  If your monitor becomes loose or falls off after 4 days call [employer] at [phone] for suggestions on securing your monitor.    Follow-Up: At [facility], you and your health needs are our priority.  As part of our continuing mission to provide you with exceptional heart care, we have created designated Provider Care Teams.  These  Care Teams include your primary Cardiologist (physician) and Advanced Practice Providers (APPs -  Physician Assistants and Nurse Practitioners) who all work together to provide you with the care you need, when you need it.  We recommend signing up for the patient portal called "MyChart".  Sign up information is provided on this After Visit Summary.  MyChart is used to connect with patients for Virtual Visits (Telemedicine).  Patients are able to view lab/test results, encounter notes, upcoming appointments, etc.  Non-urgent messages can be sent to your provider as well.   To learn more about what you can do with MyChart, go to [email or web address].    Your next appointment:   3 month(s)  The format for your next appointment:   In Person  Provider:   Oswaldo Milian, MD

## 2020-11-06 LAB — SEDIMENTATION RATE: Sed Rate: 2 mm/hr (ref 0–15)

## 2020-11-06 LAB — C-REACTIVE PROTEIN: CRP: <1 mg/L (ref 0–10)

## 2020-11-07 ENCOUNTER — Other Ambulatory Visit: Payer: Self-pay

## 2020-11-07 ENCOUNTER — Encounter (HOSPITAL_BASED_OUTPATIENT_CLINIC_OR_DEPARTMENT_OTHER): Payer: Self-pay | Admitting: Emergency Medicine

## 2020-11-07 ENCOUNTER — Emergency Department (HOSPITAL_BASED_OUTPATIENT_CLINIC_OR_DEPARTMENT_OTHER)
Admission: EM | Admit: 2020-11-07 | Discharge: 2020-11-07 | Disposition: A | Discharge status: Home / Self Care | Payer: 59 | Attending: Emergency Medicine | Admitting: Emergency Medicine

## 2020-11-07 DIAGNOSIS — F419 Anxiety disorder, unspecified: Secondary | ICD-10-CM | POA: Diagnosis not present

## 2020-11-07 MED ORDER — LORAZEPAM 1 MG PO TABS
1.0000 mg | ORAL_TABLET | Freq: Once | ORAL | Status: AC
  Administered 2020-11-07: 1 mg via ORAL
  Filled 2020-11-07: qty 1

## 2020-11-07 NOTE — Discharge Instructions (Addendum)
Follow-up with your primary care doctor and psychiatrist.  Return here as needed for any worsening symptoms.

## 2020-11-07 NOTE — ED Triage Notes (Signed)
Pt awoke having hard time breathing, felt his heart beating fast. Pt reports having history of panic attacks.

## 2020-11-07 NOTE — ED Provider Notes (Signed)
[facility] EMERGENCY DEPT Provider Note   CSN: [id number] Arrival date & time: 11/07/20  1415     History Chief Complaint  Patient presents with  . Shortness of Breath    Terrence Baker is a 30 y.o. male.  Patient is a 30 year old male who presents with anxiety.  He has a history of anxiety which is recently been escalating.  He has had some issues at work which is made to escalate.  Today he had an anxiety attack which she describes as a panic attack.  He felt like he was going to die.  He still feels like he is going to die.  He felt like his heart was racing.  He says that he does not really have any shortness of breath or chest pain he just felt like his heart was racing and he had to sit down because of that.  He denies any SI/HI.  He is seeing a psychiatrist and counselor as well as his PCP.  He was started on Prozac although he is only taken about 3 doses of it because he says he does not like the way it makes him feel.  He is not currently taking it.  He was prescribed hydroxyzine for intermittent anxiety but he does not feel like it really works.  He is also being followed by a cardiologist given his heart palpitations and some chest pain.  He is scheduled to have a Zio patch placed and scheduled for an exercise stress test next week.        Past Medical History:  Diagnosis Date  . Anxiety   . GERD (gastroesophageal reflux disease)   . No pertinent past medical history     Patient Active Problem List   Diagnosis Date Noted  . Severe episode of recurrent major depressive disorder, without psychotic features (HCC) 10/08/2020  . Generalized anxiety disorder 09/10/2020  . Major depressive disorder, single episode, moderate (HCC) 09/10/2020    Past Surgical History:  Procedure Laterality Date  . NO PAST SURGERIES         Family History  Problem Relation Age of Onset  . Diabetes Father     Social History   Tobacco Use  . Smoking status: Never Smoker   . Smokeless tobacco: Never Used  Vaping Use  . Vaping Use: Never used  Substance Use Topics  . Alcohol use: Never  . Drug use: Never    Home Medications Prior to Admission medications   Medication Sig Start Date End Date Taking? Authorizing Provider  FLUoxetine (PROZAC) 10 MG capsule Take 1 capsule (10 mg total) by mouth daily. 10/08/20  Yes Toy Cookey E, NP  hydrOXYzine (ATARAX/VISTARIL) 25 MG tablet Take 1 tablet (25 mg total) by mouth every 8 (eight) hours as needed. May cause drowsiness 10/08/20  Yes Toy Cookey E, NP  omeprazole (PRILOSEC) 20 MG capsule Take 1 capsule (20 mg total) by mouth daily. 10/08/20  Yes Zonia Kief, Amy J, NP  traZODone (DESYREL) 50 MG tablet Take 1 tablet (50 mg total) by mouth at bedtime as needed for sleep. 10/08/20  Yes Shanna Cisco, NP    Allergies    Patient has no known allergies.  Review of Systems   Review of Systems  Constitutional: Negative for chills, diaphoresis, fatigue and fever.  HENT: Negative for congestion, rhinorrhea and sneezing.   Eyes: Negative.   Respiratory: Negative for cough, chest tightness and shortness of breath.   Cardiovascular: Positive for palpitations. Negative for chest pain and  leg swelling.  Gastrointestinal: Negative for abdominal pain, blood in stool, diarrhea, nausea and vomiting.  Genitourinary: Negative for difficulty urinating, flank pain, frequency and hematuria.  Musculoskeletal: Negative for arthralgias and back pain.  Skin: Negative for rash.  Neurological: Negative for dizziness, speech difficulty, weakness, numbness and headaches.  Psychiatric/Behavioral: Negative for hallucinations and suicidal ideas. The patient is nervous/anxious.     Physical Exam Updated Vital Signs BP 112/82   Pulse 60   Temp 98 F (36.7 C) (Oral)   Resp 20   Ht 5\' 8"  (1.727 m)   Wt 71 kg   SpO2 100%   BMI 23.80 kg/m   Physical Exam Constitutional:      Appearance: He is well-developed.  HENT:     Head:  Normocephalic and atraumatic.  Eyes:     Pupils: Pupils are equal, round, and reactive to light.  Cardiovascular:     Rate and Rhythm: Normal rate and regular rhythm.     Heart sounds: Normal heart sounds.  Pulmonary:     Effort: Pulmonary effort is normal. No respiratory distress.     Breath sounds: Normal breath sounds. No wheezing or rales.  Chest:     Chest wall: No tenderness.  Abdominal:     General: Bowel sounds are normal.     Palpations: Abdomen is soft.     Tenderness: There is no abdominal tenderness. There is no guarding or rebound.  Musculoskeletal:        General: Normal range of motion.     Cervical back: Normal range of motion and neck supple.  Lymphadenopathy:     Cervical: No cervical adenopathy.  Skin:    General: Skin is warm and dry.     Findings: No rash.  Neurological:     Mental Status: He is alert and oriented to person, place, and time.     ED Results / Procedures / Treatments   Labs (all labs ordered are listed, but only abnormal results are displayed) Labs Reviewed - No data to display  EKG EKG Interpretation  Date/Time:  Friday Nov 07 2020 14:40:50 EDT Ventricular Rate:  61 PR Interval:  178 QRS Duration: 88 QT Interval:  387 QTC Calculation: 390 R Axis:   102 Text Interpretation: Sinus arrhythmia Borderline right axis deviation Probable left ventricular hypertrophy ST elevation suggests acute pericarditis No significant change since prior 2/22 Confirmed by 3/22 [phone]) on 11/07/2020 2:48:17 PM   Radiology No results found.  Procedures Procedures   Medications Ordered in ED Medications  LORazepam (ATIVAN) tablet 1 mg (1 mg Oral Given 11/07/20 1528)    ED Course  I have reviewed the triage vital signs and the nursing notes.  Pertinent labs & imaging results that were available during my care of the patient were reviewed by me and considered in my medical decision making (see chart for details).    MDM  Rules/Calculators/A&P                          Patient is a 30 year old male who presents with anxiety.  He does not have any chest pain or shortness of breath.  His EKG does not show any ischemic changes.  He denies any SI.  He was given a dose of Ativan which improved his symptoms.  I did counsel him on retrying the Prozac.  I advised him that usually if he takes it for a while, the side effects will improve.  And he  needs to give it a chance.  If he does not want to take that, he needs to talk to his psychiatrist about changing it to a different medication to help better manage his anxiety.  He will follow-up with his PCP and psychiatrist.  He also is in the process of having a cardiology evaluation for his palpitations.  Return precautions were given. Final Clinical Impression(s) / ED Diagnoses Final diagnoses:  Anxiety    Rx / DC Orders ED Discharge Orders    None       Rolan Bucco, MD 11/07/20 1626

## 2020-11-07 NOTE — ED Notes (Signed)
EKG obtained per MD order.

## 2020-11-09 ENCOUNTER — Other Ambulatory Visit: Payer: Self-pay

## 2020-11-09 ENCOUNTER — Emergency Department (HOSPITAL_COMMUNITY): Admission: EM | Admit: 2020-11-09 | Discharge: 2020-11-09 | Discharge status: Against Medical Advice | Payer: 59

## 2020-11-09 NOTE — ED Notes (Signed)
Pt called twice. First time pt was speaking with a friend and told the tech to hold on. Second time pt outside speaking with same friend

## 2020-11-12 ENCOUNTER — Encounter (HOSPITAL_COMMUNITY): Payer: Self-pay

## 2020-11-12 ENCOUNTER — Telehealth (HOSPITAL_COMMUNITY): Payer: Self-pay | Admitting: *Deleted

## 2020-11-12 ENCOUNTER — Emergency Department (HOSPITAL_COMMUNITY)
Admission: EM | Admit: 2020-11-12 | Discharge: 2020-11-12 | Disposition: A | Discharge status: Home / Self Care | Payer: 59 | Attending: Emergency Medicine | Admitting: Emergency Medicine

## 2020-11-12 ENCOUNTER — Other Ambulatory Visit: Payer: Self-pay

## 2020-11-12 DIAGNOSIS — F419 Anxiety disorder, unspecified: Secondary | ICD-10-CM

## 2020-11-12 DIAGNOSIS — K2100 Gastro-esophageal reflux disease with esophagitis, without bleeding: Secondary | ICD-10-CM | POA: Insufficient documentation

## 2020-11-12 DIAGNOSIS — R072 Precordial pain: Secondary | ICD-10-CM | POA: Diagnosis present

## 2020-11-12 MED ORDER — OMEPRAZOLE 20 MG PO CPDR: 20.0000 mg | DELAYED_RELEASE_CAPSULE | Freq: Every day | ORAL | 0 refills | Status: DC

## 2020-11-12 MED ORDER — SUCRALFATE 1 GM/10ML PO SUSP
1.0000 g | Freq: Once | ORAL | Status: AC
  Administered 2020-11-12: 1 g via ORAL
  Filled 2020-11-12: qty 10

## 2020-11-12 MED ORDER — SUCRALFATE 1 G PO TABS: 1.0000 g | ORAL_TABLET | Freq: Three times a day (TID) | ORAL | 0 refills | Status: AC

## 2020-11-12 MED ORDER — PANTOPRAZOLE SODIUM 40 MG PO TBEC
40.0000 mg | DELAYED_RELEASE_TABLET | Freq: Every day | ORAL | Status: DC
  Administered 2020-11-12: 40 mg via ORAL
  Filled 2020-11-12: qty 1

## 2020-11-12 NOTE — Discharge Instructions (Addendum)
As discussed, today's evaluation has been generally reassuring.  Please continue taking your Prozac.  In addition, please begin taking your omeprazole starting tomorrow and take it daily for the next 2 weeks.  During this time you will also take Carafate, 3 times daily.  This combination of medication, omeprazole and Carafate should improve your heartburn symptoms.  In addition to following up with your cardiologist tomorrow follow-up with your physician next week.

## 2020-11-12 NOTE — Telephone Encounter (Signed)
Close encounter 

## 2020-11-12 NOTE — ED Triage Notes (Signed)
Pt reports burning sensation from abdomen to throat. Sts hx of acid reflux but does not take medications. Also sts struggling with anxiety.

## 2020-11-12 NOTE — ED Provider Notes (Signed)
[facility]-EMERGENCY DEPT Provider Note   CSN: [id number] Arrival date & time: 11/12/20  2057     History Chief Complaint  Patient presents with  . Heartburn  . Anxiety    Terrence Baker is a 30 y.o. male.  HPI Patient presents with concern of burning sensation in his sternum, epigastrium, following an additional episode of left arm pain that is resolved.  Epigastric and sternal pain is worse after food intake.  Patient notes a history of anxiety,Began taking Prozac but only 1 week ago.  He is scheduled to follow-up with cardiology tomorrow, primary care next week.  He was previously prescribed omeprazole for presumed heartburn, but has not been taking it due to concern for the medication.      Past Medical History:  Diagnosis Date  . Anxiety   . GERD (gastroesophageal reflux disease)   . No pertinent past medical history     Patient Active Problem List   Diagnosis Date Noted  . Severe episode of recurrent major depressive disorder, without psychotic features (HCC) 10/08/2020  . Generalized anxiety disorder 09/10/2020  . Major depressive disorder, single episode, moderate (HCC) 09/10/2020    Past Surgical History:  Procedure Laterality Date  . NO PAST SURGERIES         Family History  Problem Relation Age of Onset  . Diabetes Father     Social History   Tobacco Use  . Smoking status: Never Smoker  . Smokeless tobacco: Never Used  Vaping Use  . Vaping Use: Never used  Substance Use Topics  . Alcohol use: Never  . Drug use: Never    Home Medications Prior to Admission medications   Medication Sig Start Date End Date Taking? Authorizing Provider  omeprazole (PRILOSEC) 20 MG capsule Take 1 capsule (20 mg total) by mouth daily. Take one tablet daily 11/12/20  Yes Gerhard Munch, MD  sucralfate (CARAFATE) 1 g tablet Take 1 tablet (1 g total) by mouth 4 (four) times daily -  with meals and at bedtime for 14 days. 11/12/20 11/26/20 Yes  Gerhard Munch, MD  FLUoxetine (PROZAC) 10 MG capsule Take 1 capsule (10 mg total) by mouth daily. 10/08/20   Shanna Cisco, NP  hydrOXYzine (ATARAX/VISTARIL) 25 MG tablet Take 1 tablet (25 mg total) by mouth every 8 (eight) hours as needed. May cause drowsiness 10/08/20   Toy Cookey E, NP  omeprazole (PRILOSEC) 20 MG capsule Take 1 capsule (20 mg total) by mouth daily. 10/08/20   Rema Fendt, NP  traZODone (DESYREL) 50 MG tablet Take 1 tablet (50 mg total) by mouth at bedtime as needed for sleep. 10/08/20   Shanna Cisco, NP    Allergies    Patient has no known allergies.  Review of Systems   Review of Systems  Constitutional:       Per HPI, otherwise negative  HENT:       Per HPI, otherwise negative  Respiratory:       Per HPI, otherwise negative  Cardiovascular:       Per HPI, otherwise negative  Gastrointestinal: Negative for vomiting.  Endocrine:       Negative aside from HPI  Genitourinary:       Neg aside from HPI   Musculoskeletal:       Per HPI, otherwise negative  Skin: Negative.   Neurological: Negative for syncope.    Physical Exam Updated Vital Signs BP 119/73 (BP Location: Left Arm)   Pulse 61  Temp 98 F (36.7 C) (Oral)   Resp 16   SpO2 99%   Physical Exam Vitals and nursing note reviewed.  Constitutional:      General: He is not in acute distress.    Appearance: He is well-developed.  HENT:     Head: Normocephalic and atraumatic.  Eyes:     Conjunctiva/sclera: Conjunctivae normal.  Cardiovascular:     Rate and Rhythm: Normal rate and regular rhythm.  Pulmonary:     Effort: Pulmonary effort is normal. No respiratory distress.     Breath sounds: No stridor.  Abdominal:     General: There is no distension.  Skin:    General: Skin is warm and dry.  Neurological:     Mental Status: He is alert and oriented to person, place, and time.     ED Results / Procedures / Treatments   Chart review notable for 6 prior ED visits in the  past 6 months. Procedures Procedures   Medications Ordered in ED Medications  pantoprazole (PROTONIX) EC tablet 40 mg (has no administration in time range)  sucralfate (CARAFATE) 1 GM/10ML suspension 1 g (has no administration in time range)    ED Course  I have reviewed the triage vital signs and the nursing notes.  Pertinent labs & imaging results that were available during my care of the patient were reviewed by me and considered in my medical decision making (see chart for details).    MDM Rules/Calculators/A&P Young male, benign in appearance hemodynamically unremarkable presents with ongoing epigastric, sternal discomfort, low suspicion for atypical ACS given his description of symptoms, prior evaluation.  Patient's anxiety is likely contributing to his symptoms, here no evidence for other acute new phenomena, patient amenable to initiation of omeprazole, Carafate as previously prescribed, following up with his physician as previously scheduled tomorrow.  Patient discharged in stable condition. Final Clinical Impression(s) / ED Diagnoses Final diagnoses:  Anxiousness  Gastroesophageal reflux disease with esophagitis without hemorrhage    Rx / DC Orders ED Discharge Orders         Ordered    omeprazole (PRILOSEC) 20 MG capsule  Daily        11/12/20 2204    sucralfate (CARAFATE) 1 g tablet  3 times daily with meals & bedtime       Note to Pharmacy: Take for one week   11/12/20 2204           Gerhard Munch, MD 11/12/20 2206

## 2020-11-13 ENCOUNTER — Ambulatory Visit (HOSPITAL_COMMUNITY)
Admission: RE | Admit: 2020-11-13 | Discharge: 2020-11-13 | Disposition: A | Discharge status: Home / Self Care | Payer: 59 | Source: Ambulatory Visit | Attending: Cardiology | Admitting: Cardiology

## 2020-11-13 DIAGNOSIS — R079 Chest pain, unspecified: Secondary | ICD-10-CM | POA: Diagnosis present

## 2020-11-13 LAB — EXERCISE TOLERANCE TEST
Estimated workload: 13.6 METS
Exercise duration (min): 12 min
Exercise duration (sec): 8 s
MPHR: 193 {beats}/min
Peak HR: 193 {beats}/min
Percent HR: 100 %
Rest HR: 84 {beats}/min

## 2020-11-14 ENCOUNTER — Other Ambulatory Visit: Payer: Self-pay

## 2020-11-14 ENCOUNTER — Encounter (HOSPITAL_COMMUNITY): Payer: Self-pay

## 2020-11-14 ENCOUNTER — Emergency Department (HOSPITAL_COMMUNITY)
Admission: EM | Admit: 2020-11-14 | Discharge: 2020-11-14 | Discharge status: Against Medical Advice | Payer: 59 | Attending: Emergency Medicine | Admitting: Emergency Medicine

## 2020-11-14 DIAGNOSIS — R5383 Other fatigue: Secondary | ICD-10-CM | POA: Diagnosis not present

## 2020-11-14 DIAGNOSIS — R419 Unspecified symptoms and signs involving cognitive functions and awareness: Secondary | ICD-10-CM | POA: Diagnosis present

## 2020-11-14 DIAGNOSIS — Z5321 Procedure and treatment not carried out due to patient leaving prior to being seen by health care provider: Secondary | ICD-10-CM | POA: Diagnosis not present

## 2020-11-14 LAB — COMPREHENSIVE METABOLIC PANEL
ALT: 26 U/L (ref 0–44)
AST: 23 U/L (ref 15–41)
Albumin: 4.7 g/dL (ref 3.5–5.0)
Alkaline Phosphatase: 39 U/L (ref 38–126)
Anion gap: 6 (ref 5–15)
BUN: 21 mg/dL — ABNORMAL HIGH (ref 6–20)
CO2: 29 mmol/L (ref 22–32)
Calcium: 9.6 mg/dL (ref 8.9–10.3)
Chloride: 103 mmol/L (ref 98–111)
Creatinine, Ser: 1.11 mg/dL (ref 0.61–1.24)
GFR, Estimated: >60 mL/min (ref >60)
Glucose, Bld: 95 mg/dL (ref 70–99)
Potassium: 4.1 mmol/L (ref 3.5–5.1)
Sodium: 138 mmol/L (ref 135–145)
Total Bilirubin: 0.7 mg/dL (ref 0.3–1.2)
Total Protein: 7.8 g/dL (ref 6.5–8.1)

## 2020-11-14 LAB — CBC WITH DIFFERENTIAL/PLATELET
Abs Immature Granulocytes: 0.01 10*3/uL (ref 0.00–0.07)
Basophils Absolute: 0 10*3/uL (ref 0.0–0.1)
Basophils Relative: 0 %
Eosinophils Absolute: 0 10*3/uL (ref 0.0–0.5)
Eosinophils Relative: 1 %
HCT: 45.5 % (ref 39.0–52.0)
Hemoglobin: 14.9 g/dL (ref 13.0–17.0)
Immature Granulocytes: 0 %
Lymphocytes Relative: 48 %
Lymphs Abs: 2 10*3/uL (ref 0.7–4.0)
MCH: 28.5 pg (ref 26.0–34.0)
MCHC: 32.7 g/dL (ref 30.0–36.0)
MCV: 87 fL (ref 80.0–100.0)
Monocytes Absolute: 0.5 10*3/uL (ref 0.1–1.0)
Monocytes Relative: 12 %
Neutro Abs: 1.7 10*3/uL (ref 1.7–7.7)
Neutrophils Relative %: 39 %
Platelets: 142 10*3/uL — ABNORMAL LOW (ref 150–400)
RBC: 5.23 MIL/uL (ref 4.22–5.81)
RDW: 13.1 % (ref 11.5–15.5)
WBC: 4.3 10*3/uL (ref 4.0–10.5)
nRBC: 0 % (ref 0.0–0.2)

## 2020-11-14 NOTE — ED Triage Notes (Signed)
Patient states he feels "extremely fatigued." Patient states he called [employer] to get to the ED. patient also c/o anxiety.

## 2020-11-14 NOTE — ED Provider Notes (Signed)
Emergency Medicine Provider Triage Evaluation Note  Matix Henshaw , a 30 y.o. male  was evaluated in triage.  Pt complains of extreme fatigue, onset today getting organized for the day. Seen by cardiology yesterday for anxiety, had a stress test. Taking Prozac for a week.   Review of Systems  Positive: Fatigue, weakness Negative: Fever, vomiting  Physical Exam  BP 125/75 (BP Location: Left Arm)   Pulse 68   Temp 97.6 F (36.4 C) (Oral)   Resp 18   SpO2 100%  Gen:   Awake, no distress   Resp:  Normal effort  MSK:   Moves extremities without difficulty  Other:    Medical Decision Making  Medically screening exam initiated at 4:34 PM.  Appropriate orders placed.  Romie Tay was informed that the remainder of the evaluation will be completed by another provider, this initial triage assessment does not replace that evaluation, and the importance of remaining in the ED until their evaluation is complete.     Jeannie Fend, PA-C 11/14/20 1636    Mancel Bale, MD 11/17/20 [phone]

## 2020-11-17 NOTE — Progress Notes (Signed)
Virtual Visit via Telephone Note  I connected with Terrence Baker, on 11/18/2020 at 3:35 PM by telephone due to the COVID-19 pandemic and verified that I am speaking with the correct person using two identifiers.  Due to current restrictions/limitations of in-office visits due to the COVID-19 pandemic, this scheduled clinical appointment was converted to a telehealth visit.   Consent: I discussed the limitations, risks, security and privacy concerns of performing an evaluation and management service by telephone and the availability of in person appointments. I also discussed with the patient that there may be a patient responsible charge related to this service. The patient expressed understanding and agreed to proceed.  Location of Patient: Home  Location of Provider: [facility] [place] participating in Telemedicine visit: Delene Loll, NP [place], [place]  History of Present Illness: Terrence Baker is a 30 year-old male who presents for anxiety and depression follow-up. Initially patient shared with provider a summary for today's visit being anxiety and depression follow-up. Provider was able to confirm with patient that he had recent visits at the emergency department, [facility], and counseling sessions.   Patient then asked provider to hold for a moment. Patient returned to the phone accompanied by his Mother, Terrence Baker, via phone call. Patient gave verbal consent for Mother to join and participate during telemedicine visit. Mother is self-identified as a Marine scientist and serves as part historian. Her initial statement to the provider mentioned that she was aware the provider was on an hour long call prior to calling the patient and that she has a keen ear.  Provider then shared summary of what was discussed with the patient prior to her joining the call. Mother expressed understanding.   1. ANXIETY AND DEPRESSION FOLLOW-UP: Visit  08/22/2020 with primary provider: - Continue Hydroxyzine as prescribed.  -Referral to Social Work for counseling and community resources. - Patient provided with contact information [facility] and accepts walk-ins.  Visit 10/08/2020 with primary provider: - Stable.  - Denies thoughts of self-harm, suicidal ideations, and homicidal ideations.  - Keep appointments with [facility] as scheduled.  Visit 11/18/2020 with primary provider: Reports recently prescribed and taking Prozac per [facility]. Reports concern that he does not feel the medication is helping. Thinks maybe needs an increase in dosage. Also, he is interested in discontinuing Prozac and possibly beginning Lexapro. Endorses taking Hydroxyzine as prescribed.  Mother reports patient is not taking Trazodone because of the side effect of increased drowsiness into the next day. Reports Prozac does help with sleep so he usually takes this at bedtime. Reports concern that patient is having frequent visits to the emergency department especially over the last week related to anxiety.   Mother reports patient is unable to drive because of anxiety. Reports he does not feel comfortable returning to work related to an unpleasant and stressful work environment. She is requesting an extended work excuse note so that patient does not have to return to work as of yet. Mother states a 3 day work excuse note from primary provider would be acceptable until patient is able to be seen by [facility] provider for an extended note.   Mother currently living in [place]. Reports that she would like patient to return to [place] to live with her for a while so that she can help manage his care in close proximity. Patient is not ready to return as of yet.  Patient reports he is  unsure of his next visit with [facility] but thinks it is either June or July.   Patient denies thoughts of self-harm, suicidal  ideations, and homicidal ideations.   2. EXPLANATION OF LAB RESULTS: Mother asked provider for an update of most recent labs from Emergency Department visit on 11/14/2020, most recent hemoglobin A1c, most recent thyroid level, and most recent stress test results.   Mother reports concerns about historically low platelet count. Prefers that the patient be seen by a specialist for this.   Mother reports she was unaware that patient has prediabetes. Patient confirms that he also was unaware that he has prediabetes. Mother requesting glucometer, lancets, and test strips related to concern that patient's blood sugars are possibly too low which is causing some of the frequent symptoms he is experiencing.   Mother reports that she would like the patient to have a sugar tolerance test.   Mother is concerned about patient's blurry vision and feels this could be worsened by prediabetes. She would like for him to have a detailed eye exam.  Mother is concerned that patient is not aware of the correct dietary measures to manage prediabetes. She would like for him to see a nutritionist.    Past Medical History:  Diagnosis Date  . Anxiety   . GERD (gastroesophageal reflux disease)   . No pertinent past medical history    No Known Allergies  Current Outpatient Medications on File Prior to Visit  Medication Sig Dispense Refill  . FLUoxetine (PROZAC) 10 MG capsule Take 1 capsule (10 mg total) by mouth daily. 30 capsule 2  . hydrOXYzine (ATARAX/VISTARIL) 25 MG tablet Take 1 tablet (25 mg total) by mouth every 8 (eight) hours as needed. May cause drowsiness 30 tablet 0  . omeprazole (PRILOSEC) 20 MG capsule Take 1 capsule (20 mg total) by mouth daily. 30 capsule 2  . omeprazole (PRILOSEC) 20 MG capsule Take 1 capsule (20 mg total) by mouth daily. Take one tablet daily 14 capsule 0  . sucralfate (CARAFATE) 1 g tablet Take 1 tablet (1 g total) by mouth 4 (four) times daily -  with meals and at bedtime for 14  days. 56 tablet 0  . traZODone (DESYREL) 50 MG tablet Take 1 tablet (50 mg total) by mouth at bedtime as needed for sleep. 30 tablet 2   No current facility-administered medications on file prior to visit.    Observations/Objective: Alert and oriented x 3. Not in acute distress. Physical examination not completed as this is a telemedicine visit.  Assessment and Plan: 1. Generalized anxiety disorder: 2. Severe episode of recurrent major depressive disorder, without psychotic features ([facility]): - Denies thoughts of self-harm, suicidal ideations, and homicidal ideations.  - Recent visits to the Emergency Department on 11/07/2020 and 11/12/2020. - Recent visits with [facility] 10/07/2020, 10/08/2020, 10/21/2020, and 11/04/2020. - Recent visits with Primary Care 08/22/2020, 10/08/2020, and today's visit.  - Continue current regimen as prescribed.  - Counseled patient that [facility] is managing his care in regards to anxiety and depression. Counseled that primary provider will be unable to increase, decrease, add, or discontinue any medications managed by Behavioral Health. Patient and Mother verbalized understanding.  - Counseled patient to follow-up with [facility] sooner than June/July 2022 appointment related to the need for review of pharmacological management. Patient and Mother verbalized agreement.  - Patient was given clear instructions to go to Emergency Department if symptoms worsen  or new problems develop.The patient verbalized understanding.   3.  Prediabetes: - Mother reports she was unaware that patient has prediabetes. Patient confirms that he also was unaware that he has prediabetes. Mother requesting glucometer, lancets, and test strips related to concern that patient's blood sugars are possibly too low which is causing some of the frequent symptoms he is experiencing.  - Counseled patient and Mother that provider responded to lab results via [facility] on 10/10/2020 at 7:30 am.  It was also confirmed on 10/10/2020 at 12:27 pm that patient did view result note from provider through [facility]. Patient reports he does not recall seeing results.  - Per patient request glucometer, lancets, and testing strips as prescribed. - Per request referral to Medical Nutrition Therapy for further evaluation and management. - Per patient request referral to Endocrinology for further evaluation and management. -  Counseled to practice healthy eating habits of fresh fruit and vegetables, lean baked meats such as chicken, fish, and [place]; limit breads, rice, pastas, and desserts; practice regular aerobic exercise (at least 150 minutes a week as tolerated). - Patient encouraged to follow-up with primary provider in  6 months or sooner if needed. - Amb ref to Medical Nutrition Therapy-MNT - Blood Glucose Monitoring Suppl (TRUE METRIX METER) w/Device KIT; Use as directed  Dispense: 1 kit; Refill: 0 - TRUEplus Lancets 28G MISC; Use as directed  Dispense: 100 each; Refill: 4 - glucose blood (TRUE METRIX BLOOD GLUCOSE TEST) test strip; Use as instructed  Dispense: 100 each; Refill: 12 - Ambulatory referral to Endocrinology  4. Diabetic eye exam [facility]): - Per request referral to Ophthalmology for prediabetic eye examination. - Ambulatory referral to Ophthalmology  5. Thrombocytopenia [facility]): - Per request referral to Hematology / Oncology for further evaluation and management. - Ambulatory referral to Hematology / Oncology  6. Encounter to obtain excuse from work: - Mother reports patient does not feel comfortable returning to work related to an unpleasant and stressful work environment. - As requested from Mother patient provided with 3 day excuse from work.  - Counseled patient and Mother that an extended work excuse note would need to be obtained from [employer] as his primary concern for being unable to attend work is related to anxiety and depression for which they are managing his care.  Patient and Mother verbalized understanding.  7. Encounter for laboratory test: - Provider explained to both patient and Mother the following lab results:  CMP and CBC from hospital admission 11/14/2020.  Hemoglobin A1c from 10/08/2020.  TSH from 08/11/2020.  Counseled that Stress Test would need to be explained from the rendering provider. Patient and Mother expressed understanding.  Follow Up Instructions: Follow-up with primary provider as scheduled. Follow-up with primary provider in 6 months or sooner if needed for prediabetes. Keep appointments with [facility].   Patient was given clear instructions to go to Emergency Department or return to medical center if symptoms don't improve, worsen, or new problems develop.The patient verbalized understanding.  I discussed the assessment and treatment plan with the patient. The patient was provided an opportunity to ask questions and all were answered. The patient agreed with the plan and demonstrated an understanding of the instructions.   The patient was advised to call back or seek an in-person evaluation if the symptoms worsen or if the condition fails to improve as anticipated.   I provided 25 minutes total of non-face-to-face time during this encounter.   Camillia Herter, NP  [facility] at [facility], [place] 11/18/2020, 3:35 PM

## 2020-11-18 ENCOUNTER — Telehealth (INDEPENDENT_AMBULATORY_CARE_PROVIDER_SITE_OTHER): Payer: 59 | Admitting: Family

## 2020-11-18 ENCOUNTER — Other Ambulatory Visit: Payer: Self-pay

## 2020-11-18 DIAGNOSIS — D696 Thrombocytopenia, unspecified: Secondary | ICD-10-CM

## 2020-11-18 DIAGNOSIS — E119 Type 2 diabetes mellitus without complications: Secondary | ICD-10-CM

## 2020-11-18 DIAGNOSIS — F332 Major depressive disorder, recurrent severe without psychotic features: Secondary | ICD-10-CM

## 2020-11-18 DIAGNOSIS — F411 Generalized anxiety disorder: Secondary | ICD-10-CM

## 2020-11-18 DIAGNOSIS — R7303 Prediabetes: Secondary | ICD-10-CM

## 2020-11-18 DIAGNOSIS — Z0100 Encounter for examination of eyes and vision without abnormal findings: Secondary | ICD-10-CM

## 2020-11-18 DIAGNOSIS — K219 Gastro-esophageal reflux disease without esophagitis: Secondary | ICD-10-CM

## 2020-11-18 DIAGNOSIS — Z0289 Encounter for other administrative examinations: Secondary | ICD-10-CM

## 2020-11-18 DIAGNOSIS — Z0189 Encounter for other specified special examinations: Secondary | ICD-10-CM

## 2020-11-18 MED ORDER — TRUE METRIX BLOOD GLUCOSE TEST VI STRP: ORAL_STRIP | 12 refills | Status: DC

## 2020-11-18 MED ORDER — TRUEPLUS LANCETS 28G MISC: 4 refills | Status: DC

## 2020-11-18 MED ORDER — TRUE METRIX METER W/DEVICE KIT: PACK | 0 refills | Status: DC

## 2020-11-18 NOTE — Progress Notes (Signed)
Anxiety f/u

## 2020-11-19 ENCOUNTER — Encounter: Payer: Self-pay | Admitting: Family

## 2020-11-19 ENCOUNTER — Telehealth: Payer: Self-pay | Admitting: Hematology and Oncology

## 2020-11-19 ENCOUNTER — Other Ambulatory Visit: Payer: Self-pay

## 2020-11-19 MED ORDER — FREESTYLE LANCETS MISC: 12 refills | Status: AC

## 2020-11-19 MED ORDER — FREESTYLE LIBRE READER DEVI: 0 refills | Status: DC

## 2020-11-19 MED ORDER — TRUEPLUS LANCETS 28G MISC: 4 refills | Status: DC

## 2020-11-19 MED ORDER — TRUE METRIX BLOOD GLUCOSE TEST VI STRP: ORAL_STRIP | 12 refills | Status: DC

## 2020-11-19 MED ORDER — FREESTYLE TEST VI STRP: ORAL_STRIP | 12 refills | Status: DC

## 2020-11-19 NOTE — Addendum Note (Signed)
Addended by: Margorie John on: 11/19/2020 05:29 PM   Modules accepted: Orders

## 2020-11-19 NOTE — Telephone Encounter (Signed)
Received a new hem referral from [employer] for thrombocytopenia. Terrence Baker has been cld and scheduled to see Dr. Leonides Schanz on 5/23 at 9am. Pt aware to arrive 15 minutes early.

## 2020-11-19 NOTE — Addendum Note (Signed)
Addended by: Rema Fendt on: 11/19/2020 05:08 PM   Modules accepted: Orders

## 2020-11-20 ENCOUNTER — Telehealth: Payer: Self-pay | Admitting: Family

## 2020-11-20 MED ORDER — FREESTYLE LITE W/DEVICE KIT: 1.0000 | PACK | Freq: Four times a day (QID) | 0 refills | Status: DC

## 2020-11-20 MED ORDER — FREESTYLE LITE TEST VI STRP: ORAL_STRIP | 12 refills | Status: DC

## 2020-11-20 NOTE — Telephone Encounter (Signed)
11/19/20-Spoke w/Kelly at pharmacy which adv that correct meter was Freestyle lite, after resending in which was not covered by insurance according to Epic, then verified  w/Kay at pharmacy after resending stated that insurance denied coverage --pharmacist is looking into the matter and will call back

## 2020-11-20 NOTE — Addendum Note (Signed)
Addended by: Margorie John on: 11/20/2020 12:16 PM   Modules accepted: Orders

## 2020-11-20 NOTE — Telephone Encounter (Signed)
Called patient regarding his concerns he had regarding his visit with his PCP. Patient then states his mother was on the phone and she wanted to talk to me. Patient gave authorization and patient mother states that ever since the patient had been seen the provider and the nurse have not been pleasant. I apologized and informed her I would follow up.   Patient mother also stated that the only meter that patient insurance will cover is the freestyle light meter. Please send a new meter to patient pharmacy with the correct test strips and lancets.

## 2020-11-21 ENCOUNTER — Telehealth (HOSPITAL_COMMUNITY): Payer: Self-pay | Admitting: *Deleted

## 2020-11-21 NOTE — Telephone Encounter (Signed)
Patient called and put his mom on the phone too to give me information to give to Dr Doyne Keel. States he sought medical help three times in the past week due to panic attacks. Stopped his Prozac three days ago, today is the fourth and also stopped Trazodone. Better without it but did start having intrusive thoughts yesterday. Caused a lot of daytime somulence and panic. Will give information to provider, he would like to speak with her.

## 2020-11-24 ENCOUNTER — Inpatient Hospital Stay: Payer: 59

## 2020-11-24 ENCOUNTER — Other Ambulatory Visit (HOSPITAL_COMMUNITY): Payer: Self-pay | Admitting: Psychiatry

## 2020-11-24 ENCOUNTER — Inpatient Hospital Stay: Payer: 59 | Attending: Hematology and Oncology | Admitting: Hematology and Oncology

## 2020-11-24 ENCOUNTER — Encounter: Payer: Self-pay | Admitting: Hematology and Oncology

## 2020-11-24 ENCOUNTER — Other Ambulatory Visit: Payer: Self-pay

## 2020-11-24 ENCOUNTER — Encounter (HOSPITAL_COMMUNITY): Payer: Self-pay | Admitting: Psychiatry

## 2020-11-24 VITALS — BP 108/47 | HR 62 | Temp 97.2°F | Resp 17 | Ht 68.0 in | Wt 154.1 lb

## 2020-11-24 DIAGNOSIS — Z79899 Other long term (current) drug therapy: Secondary | ICD-10-CM | POA: Insufficient documentation

## 2020-11-24 DIAGNOSIS — D696 Thrombocytopenia, unspecified: Secondary | ICD-10-CM | POA: Diagnosis not present

## 2020-11-24 DIAGNOSIS — F32A Depression, unspecified: Secondary | ICD-10-CM | POA: Diagnosis not present

## 2020-11-24 DIAGNOSIS — F419 Anxiety disorder, unspecified: Secondary | ICD-10-CM | POA: Diagnosis not present

## 2020-11-24 DIAGNOSIS — Z803 Family history of malignant neoplasm of breast: Secondary | ICD-10-CM | POA: Diagnosis not present

## 2020-11-24 DIAGNOSIS — F411 Generalized anxiety disorder: Secondary | ICD-10-CM

## 2020-11-24 LAB — HEPATITIS B SURFACE ANTIBODY,QUALITATIVE: Hep B S Ab: REACTIVE — AB

## 2020-11-24 LAB — CBC WITH DIFFERENTIAL (CANCER CENTER ONLY)
Abs Immature Granulocytes: 0 10*3/uL (ref 0.00–0.07)
Basophils Absolute: 0 10*3/uL (ref 0.0–0.1)
Basophils Relative: 0 %
Eosinophils Absolute: 0 10*3/uL (ref 0.0–0.5)
Eosinophils Relative: 1 %
HCT: 45.6 % (ref 39.0–52.0)
Hemoglobin: 15.1 g/dL (ref 13.0–17.0)
Immature Granulocytes: 0 %
Lymphocytes Relative: 54 %
Lymphs Abs: 1.8 10*3/uL (ref 0.7–4.0)
MCH: 28.6 pg (ref 26.0–34.0)
MCHC: 33.1 g/dL (ref 30.0–36.0)
MCV: 86.4 fL (ref 80.0–100.0)
Monocytes Absolute: 0.3 10*3/uL (ref 0.1–1.0)
Monocytes Relative: 10 %
Neutro Abs: 1.2 10*3/uL — ABNORMAL LOW (ref 1.7–7.7)
Neutrophils Relative %: 35 %
Platelet Count: 132 10*3/uL — ABNORMAL LOW (ref 150–400)
RBC: 5.28 MIL/uL (ref 4.22–5.81)
RDW: 13.3 % (ref 11.5–15.5)
WBC Count: 3.3 10*3/uL — ABNORMAL LOW (ref 4.0–10.5)
nRBC: 0 % (ref 0.0–0.2)

## 2020-11-24 LAB — CMP (CANCER CENTER ONLY)
ALT: 31 U/L (ref 0–44)
AST: 21 U/L (ref 15–41)
Albumin: 4.6 g/dL (ref 3.5–5.0)
Alkaline Phosphatase: 42 U/L (ref 38–126)
Anion gap: 7 (ref 5–15)
BUN: 15 mg/dL (ref 6–20)
CO2: 28 mmol/L (ref 22–32)
Calcium: 9.8 mg/dL (ref 8.9–10.3)
Chloride: 105 mmol/L (ref 98–111)
Creatinine: 1.07 mg/dL (ref 0.61–1.24)
GFR, Estimated: >60 mL/min (ref >60)
Glucose, Bld: 96 mg/dL (ref 70–99)
Potassium: 4.2 mmol/L (ref 3.5–5.1)
Sodium: 140 mmol/L (ref 135–145)
Total Bilirubin: 0.6 mg/dL (ref 0.3–1.2)
Total Protein: 7.9 g/dL (ref 6.5–8.1)

## 2020-11-24 LAB — PLATELET BY CITRATE

## 2020-11-24 LAB — VITAMIN B12: Vitamin B-12: 1062 pg/mL — ABNORMAL HIGH (ref 180–914)

## 2020-11-24 LAB — HEPATITIS B SURFACE ANTIGEN: Hepatitis B Surface Ag: NONREACTIVE

## 2020-11-24 LAB — HEPATITIS B CORE ANTIBODY, TOTAL: Hep B Core Total Ab: NONREACTIVE

## 2020-11-24 LAB — FERRITIN: Ferritin: 150 ng/mL (ref 24–336)

## 2020-11-24 LAB — SAVE SMEAR(SSMR), FOR PROVIDER SLIDE REVIEW

## 2020-11-24 LAB — FOLATE: Folate: 12.2 ng/mL (ref >5.9)

## 2020-11-24 LAB — IMMATURE PLATELET FRACTION: Immature Platelet Fraction: 5.2 % (ref 1.2–8.6)

## 2020-11-24 MED ORDER — MIRTAZAPINE 15 MG PO TABS: 15.0000 mg | ORAL_TABLET | Freq: Every day | ORAL | 2 refills | Status: DC

## 2020-11-24 NOTE — Telephone Encounter (Signed)
Patient informed provider that he discontinued Prozac and trazodone because he notes that they were ineffective.  He informed provider that while on Prozac he was more anxious.  He also notes that in the morning he was sluggish.  He informed provider that after being off Prozac for 3 days he began to have intrusive suicidal thoughts.  Today he denies wanting to harm himself however reports that these thoughts still occur at times.  Patient asked provider to speak to his mother who notes that he would have panic attacks every day and go to urgent care or the emergency room to help manage these attacks.  She notes that he calls her every day overly anxious.  She also informed Clinical research associate that his anxiety is interfering with his job.  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  He notes that he had to step down from his position and reports that his supervisors are disrespectful because they want to know about his mental health and his medications.  Patient also notes that his sleep has been poor.  He notes that he sleeps approximately 4 hours nightly.  Patient's mother informed Clinical research associate that he has lost a lot of weight and has poor appetite.  She notes that he has lost about 30 pounds (180 to 154).  Today he is agreeable to starting mirtazapine 15 mg nightly to help manage anxiety, appetite, depression, and sleep. Potential side effects of medication and risks vs benefits of treatment vs non-treatment were explained and discussed. All questions were answered.  Patient notes that he will be following up with a new counselor for therapy.  He also notes that he is in the process of looking for a new primary care provider.  No other concerns noted at this time.

## 2020-11-24 NOTE — Progress Notes (Signed)
[place] [phone]   Fax:([phone]) [place] NOTE  Patient Care Team: Camillia Herter, NP as PCP - General (Nurse Practitioner)  Hematological/Oncological History # Mild Thrombocytopenia 08/11/2020: WBC 4.7, Hgb 15.4, MCV 86.2, Plt 147 10/31/2020: WBC 4.9, Hgb 14.2, MCV 87.6, Plt 141 11/14/2020: WBC 4.3, Hgb 14.9, MCV 87, Plt 142 11/24/2020: establish care with Dr. Lorenso Courier   CHIEF COMPLAINTS/PURPOSE OF CONSULTATION:  "Thrombocytopenia "  HISTORY OF PRESENTING ILLNESS:  Terrence Baker 30 y.o. male with medical history significant for anxiety/depression presents for evaluation of thrombocytopenia.   On review of the previous records Terrence Baker has had a mild thrombocytopenia since at least 08/11/2020.  At that time he was noted to have a hemoglobin 15.4, MCV of 86.2, and a platelet count of 147.  On 10/31/2020 with a platelet count of 141.  Most recently on 11/14/2020 patient had a platelet count of 142.  Due to concern for this patient's mild thrombocytopenia he was referred to hematology for further evaluation and management.  On exam today Terrence Baker is accompanied by his mother via telephone.  She is a Marine scientist in [place].  The patient notes that he has had low platelet counts for at least a year and a half.  He notes that this was related to covered in [place] and then he moved to [facility] for college.  He currently denies having any issues with bleeding, bruising, or dark stools.  He notes that his urine is normal color.  He is deeply concerned about his recent weight loss and his prediabetic status with hemoglobin A1c of 5.8 last time it was checked.  Patient is also been having worsening panic attacks that have been progressive since January February of this year.  He notes that he frequently visits the emergency department for these panic episodes.  After discussion his family history is remarkable for uterine and breast cancer in his mother, a brain  tumor in his maternal grandfather, and cancer of the spine of his paternal cousin.  Patient reports that he is a never smoker never drinker and currently works as a Biomedical scientist in [place].  Other than some weakness and fatigue is not having any other focal symptom such as fevers, chills, sweats, nausea, vomiting or diarrhea.  Full 10 point ROS is listed below.  MEDICAL HISTORY:  Past Medical History:  Diagnosis Date  . Anxiety   . GERD (gastroesophageal reflux disease)   . No pertinent past medical history     SURGICAL HISTORY: Past Surgical History:  Procedure Laterality Date  . NO PAST SURGERIES      SOCIAL HISTORY: Social History   Socioeconomic History  . Marital status: Single    Spouse name: Not on file  . Number of children: Not on file  . Years of education: Not on file  . Highest education level: Not on file  Occupational History  . Not on file  Tobacco Use  . Smoking status: Never Smoker  . Smokeless tobacco: Never Used  Vaping Use  . Vaping Use: Never used  Substance and Sexual Activity  . Alcohol use: Never  . Drug use: Never  . Sexual activity: Not Currently  Other Topics Concern  . Not on file  Social History Narrative  . Not on file   Social Determinants of Health   Financial Resource Strain: Not on file  Food Insecurity: Not on file  Transportation Needs: Not on file  Physical Activity: Not on file  Stress: Not on file  Social Connections: Not on file  Intimate Partner Violence: Not on file    FAMILY HISTORY: Family History  Problem Relation Age of Onset  . Diabetes Father     ALLERGIES:  has No Known Allergies.  MEDICATIONS:  Current Outpatient Medications  Medication Sig Dispense Refill  . Blood Glucose Monitoring Suppl (FREESTYLE LITE) w/Device KIT 1 Device by Does not apply route in the morning, at noon, in the evening, and at bedtime. 1 kit 0  . Continuous Blood Gluc Receiver (FREESTYLE LIBRE READER) DEVI Use as directed  1 each 0  . FLUoxetine (PROZAC) 10 MG capsule Take 1 capsule (10 mg total) by mouth daily. (Patient not taking: Reported on 11/24/2020) 30 capsule 2  . glucose blood (FREESTYLE LITE) test strip Use as instructed 100 each 12  . hydrOXYzine (ATARAX/VISTARIL) 25 MG tablet Take 1 tablet (25 mg total) by mouth every 8 (eight) hours as needed. May cause drowsiness 30 tablet 0  . Lancets (FREESTYLE) lancets Use as instructed 100 each 12  . sucralfate (CARAFATE) 1 g tablet Take 1 tablet (1 g total) by mouth 4 (four) times daily -  with meals and at bedtime for 14 days. (Patient not taking: Reported on 11/24/2020) 56 tablet 0  . traZODone (DESYREL) 50 MG tablet Take 1 tablet (50 mg total) by mouth at bedtime as needed for sleep. (Patient not taking: Reported on 11/24/2020) 30 tablet 2   No current facility-administered medications for this visit.    REVIEW OF SYSTEMS:   Constitutional: ( - ) fevers, ( - )  chills , ( - ) night sweats Eyes: ( - ) blurriness of vision, ( - ) double vision, ( - ) watery eyes Ears, nose, mouth, throat, and face: ( - ) mucositis, ( - ) sore throat Respiratory: ( - ) cough, ( - ) dyspnea, ( - ) wheezes Cardiovascular: ( - ) palpitation, ( - ) chest discomfort, ( - ) lower extremity swelling Gastrointestinal:  ( - ) nausea, ( - ) heartburn, ( - ) change in bowel habits Skin: ( - ) abnormal skin rashes Lymphatics: ( - ) new lymphadenopathy, ( - ) easy bruising Neurological: ( - ) numbness, ( - ) tingling, ( - ) new weaknesses Behavioral/Psych: ( - ) mood change, ( - ) new changes  All other systems were reviewed with the patient and are negative.  PHYSICAL EXAMINATION: ECOG PERFORMANCE STATUS: 1 - Symptomatic but completely ambulatory  Vitals:   11/24/20 0921  BP: (!) 108/47  Pulse: 62  Resp: 17  Temp: (!) 97.2 F (36.2 C)  SpO2: 99%   Filed Weights   11/24/20 0921  Weight: 154 lb 1.6 oz (69.9 kg)    GENERAL: well appearing young African American male in NAD   SKIN: skin color, texture, turgor are normal, no rashes or significant lesions EYES: conjunctiva are pink and non-injected, sclera clear LUNGS: clear to auscultation and percussion with normal breathing effort HEART: regular rate & rhythm and no murmurs and no lower extremity edema ABDOMEN: soft, non-tender, non-distended, normal bowel sounds. No HSM appreciated.  Musculoskeletal: no cyanosis of digits and no clubbing  PSYCH: alert & oriented x 3, fluent speech NEURO: no focal motor/sensory deficits  LABORATORY DATA:  I have reviewed the data as listed CBC Latest Ref Rng & Units 11/24/2020 11/14/2020 10/31/2020  WBC 4.0 - 10.5 K/uL 3.3(L) 4.3 4.9  Hemoglobin 13.0 - 17.0 g/dL 15.1 14.9 14.2  Hematocrit 39.0 -  52.0 % 45.6 45.5 43.2  Platelets 150 - 400 K/uL 132(L) 142(L) 141(L)    CMP Latest Ref Rng & Units 11/14/2020 10/31/2020 08/11/2020  Glucose 70 - 99 mg/dL 95 94 95  BUN 6 - 20 mg/dL 21(H) 17 14  Creatinine 0.61 - 1.24 mg/dL 1.11 1.01 1.16  Sodium 135 - 145 mmol/L 138 137 137  Potassium 3.5 - 5.1 mmol/L 4.1 4.0 4.0  Chloride 98 - 111 mmol/L 103 105 100  CO2 22 - 32 mmol/L 29 24 27   Calcium 8.9 - 10.3 mg/dL 9.6 9.6 9.6  Total Protein 6.5 - 8.1 g/dL 7.8 - 7.8  Total Bilirubin 0.3 - 1.2 mg/dL 0.7 - 0.4  Alkaline Phos 38 - 126 U/L 39 - 43  AST 15 - 41 U/L 23 - 26  ALT 0 - 44 U/L 26 - 29    RADIOGRAPHIC STUDIES: EXERCISE TOLERANCE TEST (ETT)  Result Date: 11/13/2020  Blood pressure demonstrated a normal response to exercise.  There was no ST segment deviation noted during stress.  Negative exercise stress test at an adequate heart rate response of 100% MPHR.  Excellent exercise capacity achieving 13.6 mets.  This is a low risk study.     ASSESSMENT & PLAN Terrence Baker 30 y.o. male with medical history significant for anxiety/depression presents for evaluation of thrombocytopenia.   After review of the labs, review of the records, and discussion with the patient the patients  findings are most consistent with a mild thrombocytopenia.  This is not associated with any other cytopenias at this time.  Given these findings it is not clear what is causing his mild lab abnormality.  As such we will do a full work-up to include nutritional panel, viral serologies, and peripheral blood film review.  In the event no clear etiology without recommend pursuing a liver and splenic ultrasound.  The patient voiced understanding of the plan moving forward.  # Mild Thrombocytopenia -- Findings are most consistent with a mild thrombocytopenia of unclear etiology. -- Patient is not currently taking any medications associated with thrombocytopenia -- We will order nutritional panel to include vitamin B12 and folate -- Viral work-up with hepatitis B, prior hepatitis C and HIV were negative.  -- We will review peripheral blood film in order to assess for pseudothrombocytopenia or clumping -- Can consider liver/splenic ultrasound in the event that the thrombocytopenia is worsening -- Return to clinic in 6 months time or sooner if indicated by one of the above labs.    Orders Placed This Encounter  Procedures  . CBC with Differential ([facility] Center Only)    Standing Status:   Future    Number of Occurrences:   1    Standing Expiration Date:   11/24/2021  . CMP ([facility] only)    Standing Status:   Future    Number of Occurrences:   1    Standing Expiration Date:   11/24/2021  . Immature Platelet Fraction    Standing Status:   Future    Number of Occurrences:   1    Standing Expiration Date:   11/24/2021  . Platelet by Citrate    Standing Status:   Future    Number of Occurrences:   1    Standing Expiration Date:   11/24/2021  . Save Smear (SSMR)    Standing Status:   Future    Number of Occurrences:   1    Standing Expiration Date:   11/24/2021  . Folate, Serum  Standing Status:   Future    Number of Occurrences:   1    Standing Expiration Date:   11/24/2021  . Vitamin B12     Standing Status:   Future    Number of Occurrences:   1    Standing Expiration Date:   11/24/2021  . Hepatitis B surface antigen    Standing Status:   Future    Number of Occurrences:   1    Standing Expiration Date:   11/24/2021  . Hepatitis B surface antibody    Standing Status:   Future    Number of Occurrences:   1    Standing Expiration Date:   11/24/2021  . Hepatitis B core antibody, total    Standing Status:   Future    Number of Occurrences:   1    Standing Expiration Date:   11/24/2021  . Ferritin    Standing Status:   Future    Number of Occurrences:   1    Standing Expiration Date:   11/24/2021    All questions were answered. The patient knows to call the clinic with any problems, questions or concerns.  A total of more than 60 minutes were spent on this encounter with face-to-face time and non-face-to-face time, including preparing to see the patient, ordering tests and/or medications, counseling the patient and coordination of care as outlined above.   Ledell Peoples, MD Department of Hematology/Oncology [place] at [facility] Phone: [phone] Pager: [phone] Email: [email or web address]  11/24/2020 10:47 AM

## 2020-11-25 ENCOUNTER — Telehealth: Payer: Self-pay | Admitting: Hematology and Oncology

## 2020-11-25 NOTE — Telephone Encounter (Signed)
Scheduled per los. Called and left msg. Mailed printout  °

## 2020-11-27 ENCOUNTER — Other Ambulatory Visit: Payer: Self-pay | Admitting: Hematology and Oncology

## 2020-11-27 ENCOUNTER — Telehealth: Payer: Self-pay | Admitting: *Deleted

## 2020-11-27 DIAGNOSIS — D696 Thrombocytopenia, unspecified: Secondary | ICD-10-CM

## 2020-11-27 NOTE — Telephone Encounter (Signed)
TCT patient regarding his recent lab results. Spoke with him and advised that his labs did not show a clear cause for slightly low platelet count. Advised he could have an [place] of liver/spleen if he wanted one. Pt states he does. Advised that I would make Dr. Leonides Schanz aware.

## 2020-11-27 NOTE — Telephone Encounter (Signed)
-----   Message from Jaci Standard, MD sent at 11/27/2020 10:20 AM EDT ----- Please let Terrence Baker know that our labs did not show a clear reason for his modestly low platelet count. If he would like, we can pursue a US of the liver/spleen to see if there are any abnormalities there to explain his findings.   ----- Message ----- From: Leory Plowman, Lab In [place] Sent: 11/24/2020  10:37 AM EDT To: Jaci Standard, MD

## 2020-12-02 ENCOUNTER — Ambulatory Visit (HOSPITAL_BASED_OUTPATIENT_CLINIC_OR_DEPARTMENT_OTHER): Payer: 59 | Attending: Family | Admitting: Internal Medicine

## 2020-12-02 ENCOUNTER — Other Ambulatory Visit: Payer: Self-pay

## 2020-12-02 VITALS — Ht 69.0 in | Wt 154.0 lb

## 2020-12-02 DIAGNOSIS — R0683 Snoring: Secondary | ICD-10-CM

## 2020-12-05 ENCOUNTER — Ambulatory Visit (INDEPENDENT_AMBULATORY_CARE_PROVIDER_SITE_OTHER): Payer: 59 | Admitting: Clinical

## 2020-12-05 DIAGNOSIS — F411 Generalized anxiety disorder: Secondary | ICD-10-CM | POA: Diagnosis not present

## 2020-12-05 NOTE — Progress Notes (Signed)
   THERAPIST PROGRESS NOTE Virtual Visit via Video Note  I connected with Terrence Baker on 12/05/20 at 10:00 AM EDT by a video enabled telemedicine application and verified that I am speaking with the correct person using two identifiers.  Location: Patient: home Provider: home office   I discussed the limitations of evaluation and management by telemedicine and the availability of in person appointments. The patient expressed understanding and agreed to proceed.   Follow Up Instructions: I discussed the assessment and treatment plan with the patient. The patient was provided an opportunity to ask questions and all were answered. The patient agreed with the plan and demonstrated an understanding of the instructions.   The patient was advised to call back or seek an in-person evaluation if the symptoms worsen or if the condition fails to improve as anticipated.   Session Time: 20 minutes  Participation Level: Active  Behavioral Response: NAAlertEuthymic  Type of Therapy: Individual Therapy  Treatment Goals addressed: Coping  Interventions: CBT and Supportive  Summary:  Terrence Baker is a 30 y.o. male who presents for the scheduled session oriented times five, appropriately dressed, and friendly. Client denied hallucinations and delusions. Client reported on today he is doing fairly well. Client reported he has been engaged with therapy and medication management for anxiety and depression. Client reported "I can get very emotional" which affects how he reacts to situations. Client reported growing up in a family where mental health wasn't openly discussed but he does have people he can talk to. Client reported he is currently in transition to another job which he is happy about. Client reported worry and fear are his two main emotions that he is learning to manage. Client reported he is not big on taking prescription medication but has seen improvement with his regimen. Client reported  he is working on incorporating prayer and meditation into his daily routine to help keep him grounded.   Suicidal/Homicidal: Nowithout intent/plan  Therapist Response:  Therapist began the session by making introduction. Therapist asked the client how he has been doing since last seen by his therapist. Therapist used CBT to actively listen and provide positive emotional support while he discussed his thoughts and feelings. Therapist used CBT to discuss impulse control by taking a time out to consider facts over emotion to make decisions. Therapist encouraged the client to practice self care. Client was scheduled for next appointment.    Plan: Return again in 5 weeks.  Diagnosis: Generalized anxiety disorder   Neena Rhymes Gaynelle Pastrana, LCSW 12/05/2020

## 2020-12-08 NOTE — Progress Notes (Signed)
Virtual Visit via Telephone Note  I connected with Terrence Baker, on 12/09/2020 at 10:51 AM by telephone due to the COVID-19 pandemic and verified that I am speaking with the correct person using two identifiers.  Due to current restrictions/limitations of in-office visits due to the COVID-19 pandemic, this scheduled clinical appointment was converted to a telehealth visit.   Consent: I discussed the limitations, risks, security and privacy concerns of performing an evaluation and management service by telephone and the availability of in person appointments. I also discussed with the patient that there may be a patient responsible charge related to this service. The patient expressed understanding and agreed to proceed.   Location of Patient: Home  Location of Provider: [facility] participating in Telemedicine visit: Delene Loll, NP [place] Else, [place]  History of Present Illness: Terrence Baker is a 30 year-old male who presents for eye referral and dermatology referral.    Reports concerns with vision being affected by anxiety, depersonalization, and derealization. Would like referral to eye doctor.  Reports concerns with facial acne, wrinkles underneath eyes, and cracking of bilateral feet. Would like referral to dermatology.    Past Medical History:  Diagnosis Date  . Anxiety   . GERD (gastroesophageal reflux disease)   . No pertinent past medical history    No Known Allergies  Current Outpatient Medications on File Prior to Visit  Medication Sig Dispense Refill  . Blood Glucose Monitoring Suppl (FREESTYLE LITE) w/Device KIT 1 Device by Does not apply route in the morning, at noon, in the evening, and at bedtime. 1 kit 0  . Continuous Blood Gluc Receiver (FREESTYLE LIBRE READER) DEVI Use as directed 1 each 0  . glucose blood (FREESTYLE LITE) test strip Use as instructed 100 each 12  . hydrOXYzine (ATARAX/VISTARIL) 25  MG tablet Take 1 tablet (25 mg total) by mouth every 8 (eight) hours as needed. May cause drowsiness 30 tablet 0  . Lancets (FREESTYLE) lancets Use as instructed 100 each 12  . mirtazapine (REMERON) 15 MG tablet Take 1 tablet (15 mg total) by mouth at bedtime. 30 tablet 2  . sucralfate (CARAFATE) 1 g tablet Take 1 tablet (1 g total) by mouth 4 (four) times daily -  with meals and at bedtime for 14 days. (Patient not taking: Reported on 11/24/2020) 56 tablet 0   No current facility-administered medications on file prior to visit.    Observations/Objective: Alert and oriented x 3. Not in acute distress. Physical examination not completed as this is a telemedicine visit.  Assessment and Plan: 1. Routine eye exam: - Per patient request referral to Ophthalmology for further evaluation and management.  - Ambulatory referral to Ophthalmology  2. Acne, unspecified acne type:  - Per patient request referral to Dermatology for further evaluation and management.  - Ambulatory referral to Dermatology   Follow Up Instructions: Referral to Ophthalmology and Dermatology.    Patient was given clear instructions to go to Emergency Department or return to medical center if symptoms don't improve, worsen, or new problems develop.The patient verbalized understanding.  I discussed the assessment and treatment plan with the patient. The patient was provided an opportunity to ask questions and all were answered. The patient agreed with the plan and demonstrated an understanding of the instructions.   The patient was advised to call back or seek an in-person evaluation if the symptoms worsen or if the condition fails to improve as anticipated.  I provided 5 minutes total of non-face-to-face time during this encounter.   Camillia Herter, NP  [facility], [place] 12/09/2020, 10:51 AM

## 2020-12-09 ENCOUNTER — Other Ambulatory Visit: Payer: Self-pay

## 2020-12-09 ENCOUNTER — Telehealth (INDEPENDENT_AMBULATORY_CARE_PROVIDER_SITE_OTHER): Payer: 59 | Admitting: Family

## 2020-12-09 DIAGNOSIS — Z0100 Encounter for examination of eyes and vision without abnormal findings: Secondary | ICD-10-CM

## 2020-12-09 DIAGNOSIS — L709 Acne, unspecified: Secondary | ICD-10-CM

## 2020-12-09 NOTE — Progress Notes (Signed)
Needs dermatology and opthalmology referral

## 2020-12-10 ENCOUNTER — Ambulatory Visit (HOSPITAL_COMMUNITY)
Admission: RE | Admit: 2020-12-10 | Discharge: 2020-12-10 | Disposition: A | Discharge status: Home / Self Care | Payer: 59 | Source: Ambulatory Visit | Attending: Hematology and Oncology | Admitting: Hematology and Oncology

## 2020-12-10 ENCOUNTER — Other Ambulatory Visit: Payer: Self-pay

## 2020-12-10 DIAGNOSIS — D696 Thrombocytopenia, unspecified: Secondary | ICD-10-CM

## 2020-12-13 DIAGNOSIS — R0683 Snoring: Secondary | ICD-10-CM

## 2020-12-13 NOTE — Procedures (Signed)
    Patient Name: Terrence Baker, Terrence Baker Date: 12/02/2020 Gender: Male D.O.B: December 07, 1990 Age (years): 30 Referring Provider: Ricky Stabs NP Height (inches): 69 Interpreting Physician: Jetty Duhamel MD, ABSM Weight (lbs): 154 RPSGT: Ulyess Mort BMI: 23 MRN: [id number] Neck Size: 14.50  CLINICAL INFORMATION Sleep Study Type: NPSG Indication for sleep study: Depression, Fatigue, Snoring Epworth Sleepiness Score: 11  SLEEP STUDY TECHNIQUE As per the AASM Manual for the Scoring of Sleep and Associated Events v2.3 (April 2016) with a hypopnea requiring 4% desaturations.  The channels recorded and monitored were frontal, central and occipital EEG, electrooculogram (EOG), submentalis EMG (chin), nasal and oral airflow, thoracic and abdominal wall motion, anterior tibialis EMG, snore microphone, electrocardiogram, and pulse oximetry.  MEDICATIONS Medications self-administered by patient taken the night of the study : MIRTAZAPINE  SLEEP ARCHITECTURE The study was initiated at 10:59:43 PM and ended at 5:23:17 AM.  Sleep onset time was 8.1 minutes and the sleep efficiency was 96.9%%. The total sleep time was 371.5 minutes.  Stage REM latency was 186.5 minutes.  The patient spent 3.9%% of the night in stage N1 sleep, 80.2%% in stage N2 sleep, 0.0%% in stage N3 and 15.9% in REM.  Alpha intrusion was absent.  Supine sleep was 42.93%.  RESPIRATORY PARAMETERS The overall apnea/hypopnea index (AHI) was 0.3 per hour. There were 0 total apneas, including 0 obstructive, 0 central and 0 mixed apneas. There were 2 hypopneas and 16 RERAs.  The AHI during Stage REM sleep was 2.0 per hour.  AHI while supine was 0.8 per hour.  The mean oxygen saturation was 97.6%. The minimum SpO2 during sleep was 92.0%.  soft snoring was noted during this study.  CARDIAC DATA The 2 lead EKG demonstrated sinus rhythm. The mean heart rate was 54.8 beats per minute. Other EKG findings include: None.  LEG  MOVEMENT DATA The total PLMS were 0 with a resulting PLMS index of 0.0. Associated arousal with leg movement index was 0.2 .  IMPRESSIONS - No significant obstructive sleep apnea occurred during this study (AHI = 0.3/h). - The patient had minimal or no oxygen desaturation during the study (Min O2 = 92.0%) - The patient snored with soft snoring volume. - No cardiac abnormalities were noted during this study. - Limb Movements total 75. Limb Movements with arousal 1. Doubt clinical significance.  DIAGNOSIS - Primary snoring  RECOMMENDATIONS - Be careful with alcohol, sedatives and other CNS depressants that may worsen sleep apnea and disrupt normal sleep architecture. - Sleep hygiene should be reviewed to assess factors that may improve sleep quality. - Weight management and regular exercise should be initiated or continued if appropriate.  [Electronically signed] 12/13/2020 11:28 AM  Jetty Duhamel MD, ABSM Diplomate, American Board of Sleep Medicine   NPI: [id number]                         Jetty Duhamel Diplomate, American Board of Sleep Medicine  ELECTRONICALLY SIGNED ON:  12/13/2020, 11:26 AM [facility] PH: [phone]   FX: [phone] ACCREDITED BY THE AMERICAN ACADEMY OF SLEEP MEDICINE

## 2020-12-24 ENCOUNTER — Telehealth: Payer: Self-pay | Admitting: *Deleted

## 2020-12-24 ENCOUNTER — Encounter: Payer: 59 | Attending: Internal Medicine | Admitting: Dietician

## 2020-12-24 ENCOUNTER — Other Ambulatory Visit: Payer: Self-pay

## 2020-12-24 ENCOUNTER — Encounter: Payer: Self-pay | Admitting: Dietician

## 2020-12-24 VITALS — Ht 69.0 in | Wt 171.4 lb

## 2020-12-24 DIAGNOSIS — R7303 Prediabetes: Secondary | ICD-10-CM | POA: Insufficient documentation

## 2020-12-24 NOTE — Progress Notes (Signed)
Medical Nutrition Therapy  Appointment Start time:  61  Appointment End time:  1720  Primary concerns today: Prediabetes  Referral diagnosis: R73.09 Prediabetes Preferred learning style: No preference indicated Learning readiness: Contemplating   NUTRITION ASSESSMENT   Anthropometrics  Ht: 5'9" Wt: 171.4 lbs  Body mass index is 25.31 kg/m.   Clinical Medical Hx: GAD, MDD, Low platelets Medications: Mirtazapine Labs: A1c - 5.8 Notable Signs/Symptoms: Anxious disposition  Lifestyle & Dietary Hx Pt called mother during appointment to listen to first 10 minutes of appointment  Pt reports going through severe anxiety for a bout of time around December, was unable to eat much, and lost weight, body weight got down to 151 lbs. Pt is now back up to 171 lbs. Pt started medication that has helped mood tremendously and replenished their desire to eat.  Pt is very physically active, goes to gym 4-5 times a week. Pt reports a goal of getting down to 10% body fat. Pt reports having bouts of low energy and acid reflux. Pt reports having a sweet tooth. Pt states candy is their vice, they will impulsively  Occasionally drinks sodas (gingerale, root beer), and is drinking body armor, vitamin water, and apple juice. Pt shops for themselves, purchased ground [place], [place] bacon, greek yogurt, spinach recently. Pt has BG meter, and checks BG in the morning before eating. FBG ~ 170  Body Composition Scale Date 12/24/20  Current Body Weight 170.3 lbs  Total Body Fat % 15.3  Visceral Fat 8  Fat-Free Mass % 84.6   Total Body Water % 65.6  Muscle-Mass lbs 31.9  BMI 24.9  Body Fat Displacement          Torso  lbs 16.1         Left Leg  lbs 3.2         Right Leg  lbs 3.2         Left Arm  lbs 1.6         Right Arm  lbs 1.6      Estimated daily fluid intake: 90 oz Supplements: Fish oil, D3, B-Complex Sleep: Better while medicated Stress / self-care: GAD, recurrent MDD Current average  weekly physical activity: Goes to the gym 3-4 days, 60 minutes. Cardio/resistance  24-Hr Dietary Recall First Meal: Sonic mozzerella sticks, [place] toast sticks, coney hot dog Snack: Tour manager Second Meal: none Snack: none Third Meal: Buger, avocado, caramelized onions, sriracha. Snack: none Beverages: water, body armor    NUTRITION DIAGNOSIS  NB-1.5 Disordered eating pattern As related to MDD and prediabetes.  As evidenced by A1c of 5.8, self reported weight loss of 10% UBW due to inability to eat resulting from MDD, and self reported tendency to skip meals and binge on sweets..   NUTRITION INTERVENTION  Nutrition education (E-1) on the following topics:  Educated patient on the pathophysiology of diabetes. This includes why our bodies need circulating blood sugar, the relationship between insulin and blood sugar, and the results of insulin resistance and/or pancreatic insufficiency on the development of diabetes. Educated patient on factors that contribute to elevation of blood sugars, such as stress, illness, injury,and food choices. Discussed the role that physical activity plays in lowering blood sugar. Educate patient on the three main macronutrients. Protein, fats, and carbohydrates. Discussed how each of these macronutrients affect blood sugar levels, especially carbohydrate, and the importance of eating a consistent amount of carbohydrate throughout the day.  Educated patient on the balanced plate eating model. Recommended lunch and dinner  be 1/2 non-starchy vegetables, 1/4 starches, and 1/4 protein. Recommended breakfast be a balance of starch and protein with a piece of fruit. Discussed with patient the importance of working towards hitting the proportions of the balanced plate consistently. Counseled patient on ways to begin recognizing each of the food groups from the balanced plate in their own meals, and how close they are to fitting the recommended proportions of the balanced  plate. Educated patient on the nutritional value of each food group on the balanced plate model. Counseled patient on allowing themselves to be present in their emotions when they consider emotional eating. Advised patient to evaluate whether the impulse to eat is hunger based, or emotionally driven.   Handouts Provided Include  Snack Time Balanced Plate  Learning Style & Readiness for Change Teaching method utilized: Visual & Auditory  Demonstrated degree of understanding via: Teach Back  Barriers to learning/adherence to lifestyle change: Recurrent MDD  Goals Established by Pt Choose Danon "Light n Fit" or Oikos "Triple Zero" greek yogurt for a sweet, low sugar protein option. Move your body armor drink to the middle of your workout. Increase your lemon water. Have some "Steam in Bag" vegetables for a fast and easy option to eat non-starchy vegetables.  Look for Dark Chocolate Dessert Hummus. Look for fasting blood sugar to be under 125 consistently. Work towards eating three meals a day, about 5-6 hours apart! Begin to build your meals using the proportions of the Balanced Plate. First, select your carb choice(starches/grains/fruits) for the meal Next, select your source of protein to pair with your carb choice. Finally, complete the remaining half of your meal with a variety of non-starchy vegetables.   MONITORING & EVALUATION Dietary intake, weekly physical activity, and fasting BG in 1 month.  Next Steps  Patient is to follow up with RD.

## 2020-12-24 NOTE — Telephone Encounter (Signed)
-----   Message from Kyra Searles, RN sent at 12/24/2020  1:58 PM EDT -----  ----- Message ----- From: Jaci Standard, MD Sent: 12/24/2020  11:13 AM EDT To: Kyra Searles, RN  Please let Mr. Aubry know that his spleen is of normal size. We are unsure what is causing his modestly low platelets. We will see him back in Nov 2022 to reassess.  ----- Message ----- From: Interface, Rad Results In Sent: 12/12/2020   7:58 AM EDT To: Jaci Standard, MD

## 2020-12-24 NOTE — Patient Instructions (Addendum)
Choose Danon "Light n Fit" or Oikos "Triple Zero" greek yogurt for a sweet, low sugar protein option.  Move your body armor drink to the middle of your workout.  Have some   Look for

## 2020-12-24 NOTE — Telephone Encounter (Signed)
Called patient with results from his [place] of his abdomen.  He had no further questions and is aware to keep his follow up in November.

## 2020-12-30 ENCOUNTER — Ambulatory Visit: Payer: 59 | Admitting: Dietician

## 2020-12-31 ENCOUNTER — Emergency Department (HOSPITAL_COMMUNITY)
Admission: EM | Admit: 2020-12-31 | Discharge: 2021-01-01 | Disposition: A | Discharge status: Home / Self Care | Payer: 59 | Attending: Physician Assistant | Admitting: Physician Assistant

## 2020-12-31 ENCOUNTER — Other Ambulatory Visit: Payer: Self-pay

## 2020-12-31 DIAGNOSIS — Z5321 Procedure and treatment not carried out due to patient leaving prior to being seen by health care provider: Secondary | ICD-10-CM | POA: Diagnosis not present

## 2020-12-31 DIAGNOSIS — R070 Pain in throat: Secondary | ICD-10-CM | POA: Insufficient documentation

## 2020-12-31 DIAGNOSIS — R202 Paresthesia of skin: Secondary | ICD-10-CM | POA: Insufficient documentation

## 2020-12-31 DIAGNOSIS — R531 Weakness: Secondary | ICD-10-CM | POA: Insufficient documentation

## 2020-12-31 LAB — COMPREHENSIVE METABOLIC PANEL
ALT: 190 U/L — ABNORMAL HIGH (ref 0–44)
AST: 73 U/L — ABNORMAL HIGH (ref 15–41)
Albumin: 4.5 g/dL (ref 3.5–5.0)
Alkaline Phosphatase: 53 U/L (ref 38–126)
Anion gap: 8 (ref 5–15)
BUN: 18 mg/dL (ref 6–20)
CO2: 28 mmol/L (ref 22–32)
Calcium: 9.5 mg/dL (ref 8.9–10.3)
Chloride: 101 mmol/L (ref 98–111)
Creatinine, Ser: 1.1 mg/dL (ref 0.61–1.24)
GFR, Estimated: >60 mL/min (ref >60)
Glucose, Bld: 125 mg/dL — ABNORMAL HIGH (ref 70–99)
Potassium: 4 mmol/L (ref 3.5–5.1)
Sodium: 137 mmol/L (ref 135–145)
Total Bilirubin: 0.5 mg/dL (ref 0.3–1.2)
Total Protein: 7.7 g/dL (ref 6.5–8.1)

## 2020-12-31 LAB — CBC WITH DIFFERENTIAL/PLATELET
Abs Immature Granulocytes: 0.04 10*3/uL (ref 0.00–0.07)
Basophils Absolute: 0 10*3/uL (ref 0.0–0.1)
Basophils Relative: 0 %
Eosinophils Absolute: 0.1 10*3/uL (ref 0.0–0.5)
Eosinophils Relative: 1 %
HCT: 42.9 % (ref 39.0–52.0)
Hemoglobin: 14.4 g/dL (ref 13.0–17.0)
Immature Granulocytes: 1 %
Lymphocytes Relative: 45 %
Lymphs Abs: 2.5 10*3/uL (ref 0.7–4.0)
MCH: 29.1 pg (ref 26.0–34.0)
MCHC: 33.6 g/dL (ref 30.0–36.0)
MCV: 86.8 fL (ref 80.0–100.0)
Monocytes Absolute: 0.6 10*3/uL (ref 0.1–1.0)
Monocytes Relative: 10 %
Neutro Abs: 2.4 10*3/uL (ref 1.7–7.7)
Neutrophils Relative %: 43 %
Platelets: 156 10*3/uL (ref 150–400)
RBC: 4.94 MIL/uL (ref 4.22–5.81)
RDW: 13.7 % (ref 11.5–15.5)
WBC: 5.5 10*3/uL (ref 4.0–10.5)
nRBC: 0 % (ref 0.0–0.2)

## 2020-12-31 NOTE — ED Provider Notes (Signed)
Emergency Medicine Provider Triage Evaluation Note  Terrence Baker , a 30 y.o. male  was evaluated in triage.  Pt complains of with generalized weakness, burping, "feeling like his body is going to "shut down".  Reports occasional numbness in his right leg.  Feels some tightness in his throat.  No fevers or chills.  No cough.  He has only had 1 shot of the Alpine series.  Denies any abdominal pain, nausea or vomiting.  Review of Systems  Positive: As above  Negative: As above  Physical Exam  BP (!) 139/99 (BP Location: Left Arm)   Pulse 98   Temp 98.6 F (37 C) (Oral)   Resp 16   Ht 5\' 11"  (1.803 m)   Wt 77.1 kg   SpO2 98%   BMI 23.71 kg/m  Gen:   Awake, no distress   Resp:  Normal effort  MSK:   Moves extremities without difficulty  Other:    Medical Decision Making  Medically screening exam initiated at 8:07 PM.  Appropriate orders placed.  Terrence Baker was informed that the remainder of the evaluation will be completed by another provider, this initial triage assessment does not replace that evaluation, and the importance of remaining in the ED until their evaluation is complete.     Terrence Baker 12/31/20 2011    2012, MD 01/09/21 [phone]

## 2020-12-31 NOTE — ED Triage Notes (Addendum)
From apt complex via EMS.  Pt reports feeling weak with hx of pre-diabetes.  EMS reports VS: 110/70, HR 80, 18 RR, 98%, CBG 88.  Hx panic attacks and gerd.  No vomiting.   Pt reports tingling in neck and had to wear a heart monitor recently, monitor was removed 1 week ago, but pt has not taken the monitor back to have it read.  Pt states his cardiologist told him his ekg in office had an abnormality and he had to wear the monitor.  Pt reports multiple problems during triage including 5.8 hbg alc and had ultrasound on liver recently as out patient.

## 2021-01-06 ENCOUNTER — Telehealth (INDEPENDENT_AMBULATORY_CARE_PROVIDER_SITE_OTHER): Payer: 59 | Admitting: Psychiatry

## 2021-01-06 ENCOUNTER — Encounter (HOSPITAL_COMMUNITY): Payer: Self-pay | Admitting: Psychiatry

## 2021-01-06 ENCOUNTER — Other Ambulatory Visit: Payer: Self-pay

## 2021-01-06 DIAGNOSIS — F411 Generalized anxiety disorder: Secondary | ICD-10-CM | POA: Diagnosis not present

## 2021-01-06 MED ORDER — MIRTAZAPINE 15 MG PO TABS: 15.0000 mg | ORAL_TABLET | Freq: Every day | ORAL | 2 refills | Status: DC

## 2021-01-06 NOTE — Progress Notes (Signed)
[facility] MD/PA/NP OP Progress Note Virtual Visit via Video Note  I connected with Terrence Baker on 01/06/21 at 10:00 AM EDT by a video enabled telemedicine application and verified that I am speaking with the correct person using two identifiers.  Location: Patient: Home Provider: Clinic   I discussed the limitations of evaluation and management by telemedicine and the availability of in person appointments. The patient expressed understanding and agreed to proceed.  I provided 30 minutes of non-face-to-face time during this encounter.   01/06/2021 10:21 AM Terrence Baker  MRN:  [id number]  Chief Complaint: "I have no more panic attacks"  HPI: 30 year old male seen today for follow up psychiatric evaluation. He has a psychiatric history of anxiety, depression, and SI. He is currently managed on Mirtazapine 15 mg nightly. He notes his medication is effective for managing his psychiatric conditions.  Today he is well groomed, pleasant, cooperative, and engaged in conversation. He informed Probation officer that since his last visit he has not had any more panic attacks.  He notes that he occasionally becomes anxious but is able to cope with it.  He also informed writer that his sleep has been better noting that he sleeps approximately 8 to 10 hours nightly.  Today provider conducted a GAD-7 and patient scored a 13, at his last visit he scored a 19.  Provider also conducted a PHQ-9 and patient scored a 10.  He endorses having decreased appetite and notes that he has gained approximately 16 pounds.  Provider informed patient that mirtazapine increase his appetite and may therefore increase his weight.  He endorsed understanding.  He notes that he has been trying to exercise more.  Today he denies SI/HI/AVH, mania, or paranoia.    Patient informed writer that at times he has symptoms of derealization/depersonalization.  He notes that he feels outside of his body at time.  He notes that since his anxiety and depression  has improved these symptoms has reduced.  Patient notes that he has been more stressed lately and is searching for a job.  He informed Probation officer that he has been going to counseling outside of [facility] to help manage his stress.    No medication changes made today.  Patient agreeable to continue medication as prescribed.  He will follow-up outpatient counseling for therapy.  No other concerns noted at this time.     Visit Diagnosis:    ICD-10-CM   1. Generalized anxiety disorder  F41.1 mirtazapine (REMERON) 15 MG tablet      Past Psychiatric History: anxiety, depression, and SI  Past Medical History:  Past Medical History:  Diagnosis Date   Anxiety    GERD (gastroesophageal reflux disease)    No pertinent past medical history     Past Surgical History:  Procedure Laterality Date   NO PAST SURGERIES      Family Psychiatric History:  Notes that fiather has undiagnosed mental health conditions  Family History:  Family History  Problem Relation Age of Onset   Diabetes Father     Social History:  Social History   Socioeconomic History   Marital status: Single    Spouse name: Not on file   Number of children: Not on file   Years of education: Not on file   Highest education level: Not on file  Occupational History   Not on file  Tobacco Use   Smoking status: Never   Smokeless tobacco: Never  Vaping Use   Vaping Use: Never used  Substance  and Sexual Activity   Alcohol use: Never   Drug use: Never   Sexual activity: Not Currently  Other Topics Concern   Not on file  Social History Narrative   Not on file   Social Determinants of Health   Financial Resource Strain: Not on file  Food Insecurity: Not on file  Transportation Needs: Not on file  Physical Activity: Not on file  Stress: Not on file  Social Connections: Not on file    Allergies: No Known Allergies  Metabolic Disorder Labs: Lab Results  Component Value Date   HGBA1C 5.8 (H) 10/08/2020   No  results found for: PROLACTIN Lab Results  Component Value Date   CHOL 177 10/08/2020   TRIG 54 10/08/2020   HDL 57 10/08/2020   CHOLHDL 3.1 10/08/2020   LDLCALC 109 (H) 10/08/2020   Lab Results  Component Value Date   TSH 1.780 08/11/2020    Therapeutic Level Labs: No results found for: LITHIUM No results found for: VALPROATE No components found for:  CBMZ  Current Medications: Current Outpatient Medications  Medication Sig Dispense Refill   Blood Glucose Monitoring Suppl (FREESTYLE LITE) w/Device KIT 1 Device by Does not apply route in the morning, at noon, in the evening, and at bedtime. 1 kit 0   Continuous Blood Gluc Receiver (FREESTYLE LIBRE READER) DEVI Use as directed 1 each 0   glucose blood (FREESTYLE LITE) test strip Use as instructed 100 each 12   Lancets (FREESTYLE) lancets Use as instructed 100 each 12   mirtazapine (REMERON) 15 MG tablet Take 1 tablet (15 mg total) by mouth at bedtime. 30 tablet 2   sucralfate (CARAFATE) 1 g tablet Take 1 tablet (1 g total) by mouth 4 (four) times daily -  with meals and at bedtime for 14 days. (Patient not taking: Reported on 11/24/2020) 56 tablet 0   No current facility-administered medications for this visit.     Musculoskeletal: Strength & Muscle Tone:  Unable to assess due to telehealth visit [place]:  Unable to assess due to telehealth visit Patient leans: N/A  Psychiatric Specialty Exam: Review of Systems  There were no vitals taken for this visit.There is no height or weight on file to calculate BMI.  General Appearance: Well Groomed  Eye Contact:  Good  Speech:  Clear and Coherent and Normal Rate  Volume:  Normal  Mood:  Euthymic  Affect:  Appropriate and Congruent  Thought Process:  Coherent, Goal Directed, and Linear  Orientation:  Full (Time, Place, and Person)  Thought Content: WDL and Logical   Suicidal Thoughts:  No  Homicidal Thoughts:  No  Memory:  Immediate;   Good Recent;   Good Remote;    Good  Judgement:  Good  Insight:  Good  Psychomotor Activity:  Normal  Concentration:  Concentration: Good and Attention Span: Good  Recall:  Good  Fund of Knowledge: Good  Language: Good  Akathisia:  No  Handed:  Right  AIMS (if indicated): not done  Assets:  Communication Skills Desire for Improvement Financial Resources/Insurance Physical Health Social Support  ADL's:  Intact  Cognition: WNL  Sleep:  Good   Screenings: GAD-7    Flowsheet Row Video Visit from 01/06/2021 in [facility] Video Visit from 10/08/2020 in [facility]  Total GAD-7 Score 12 19      PHQ2-9    Flowsheet Row Video Visit [facility]/06/2021 in [facility] Most recent reading  at 01/06/2021 10:01 AM Nutrition [facility] 12/24/2020 [facility] Most recent reading at 12/24/2020  4:26 PM Telemedicine from 12/09/2020 in Primary Care at [facility] Most recent reading at 12/09/2020 10:52 AM Office Visit from 10/08/2020 in Primary Care at [facility] Most recent reading at 10/08/2020  4:02 PM Video Visit from 10/08/2020 in [facility] Most recent reading at 10/08/2020  9:24 AM  PHQ-2 Total Score _0 PHQ-9 Total Score 10 -- _1 Flowsheet Row ED from 11/14/2020 in [place] DEPT ED from 11/12/2020 in [place] DEPT ED from 11/07/2020 in [facility]  C-SSRS RISK CATEGORY No Risk No Risk No Risk        Assessment and Plan: Patient notes that his anxiety and depression has improved since his last visit. Today no medication changes made today. Patient agreeable to continue medications as prescribed.  1. Generalized anxiety disorder  Continue- mirtazapine (REMERON) 15 MG tablet; Take 1 tablet (15 mg total) by mouth at bedtime.  Dispense: 30 tablet; Refill: 2   Follow-up in 3  months Follow-up with therapy   Salley Slaughter, NP 01/06/2021, 10:21 AM

## 2021-01-14 ENCOUNTER — Encounter: Payer: Self-pay | Admitting: Family

## 2021-01-15 ENCOUNTER — Telehealth (HOSPITAL_COMMUNITY): Payer: Self-pay | Admitting: *Deleted

## 2021-01-15 NOTE — Telephone Encounter (Signed)
Call from [place] asking to speak with his provider. I asked for information as the providers dont take direct calls. He said he had some questions about his medicine and it wasn't helping like it had when he started it.Reviewed his MAR and the only med we prescribe is Remeron. I will pass this message to B [facility] NP.

## 2021-01-16 NOTE — Telephone Encounter (Signed)
Patient notes that he stopped taking his Mirtazapine on a consistent basis and restarted it this week. He notes that he felt mentally foggy, anxious, and depressed without his medication and reports having passive SI. Today he denies SI/HI/VAH, mania, or paranoia. He also informed provider that he would take his medication around 1:00 A.M. provider informed patient that it may take a few weeks before he feels less anxious and depressed. Provider also encouraged patient to take medications around 10:00 or 10:30 to prevent feeling mentally foggy. He endorsed understanding. No other concerns noted at this time.

## 2021-01-28 ENCOUNTER — Ambulatory Visit (HOSPITAL_COMMUNITY): Payer: No Payment, Other | Admitting: Clinical

## 2021-02-02 ENCOUNTER — Other Ambulatory Visit: Payer: Self-pay

## 2021-02-02 ENCOUNTER — Encounter: Payer: Self-pay | Admitting: Dietician

## 2021-02-02 ENCOUNTER — Encounter: Payer: 59 | Attending: Internal Medicine | Admitting: Dietician

## 2021-02-02 ENCOUNTER — Telehealth (HOSPITAL_COMMUNITY): Payer: Self-pay | Admitting: Psychiatry

## 2021-02-02 VITALS — Ht 69.0 in | Wt 180.0 lb

## 2021-02-02 DIAGNOSIS — R7303 Prediabetes: Secondary | ICD-10-CM | POA: Diagnosis present

## 2021-02-02 NOTE — Telephone Encounter (Signed)
Patient called to request return call regarding medications. Patient states he is not calling for a refill, he says he needs talk with his provider. Confirmed phone [phone]

## 2021-02-02 NOTE — Progress Notes (Signed)
Medical Nutrition Therapy  Appointment Start time:  1550  Appointment End time:  1630  Primary concerns today: Prediabetes  Referral diagnosis: R73.09 Prediabetes Preferred learning style: No preference indicated Learning readiness: Contemplating   NUTRITION ASSESSMENT   Anthropometrics  Ht: 5'9" Wt: 180.0 lbs  Body mass index is 26.58 kg/m.   Clinical Medical Hx: GAD, MDD, Low platelets Medications: Mirtazapine Labs: A1c - 5.8 Notable Signs/Symptoms: Anxious disposition  Lifestyle & Dietary Hx Pt called mother during appointment to listen to appointment Pt reports not being able to get the gym as much recently. Pt states they are eating fish, popcorn for a snack. Pt does a protein shake daily with berries, yogurt, spinach, 2% milk. Pt reports having a strong sweet tooth, ice cream, gummy candies mostly late at night. Pt reports this to be impulsive. Pt reports doing a modified fast recently with a lot of water and only fish. Pt usually checks FBG in the morning or if they are symptomatic. Pt reports having an episode of weakness, faintness, and tingling in their hands recently. Pt did not check BG, but EMTs were called and said pt BG was in the 90s.  Body Composition Scale Date 12/24/20  02/02/2021  Current Body Weight 170.3 lbs 180.0  Total Body Fat % 15.3 18.1  Visceral Fat 8 10  Fat-Free Mass % 84.6 81.8   Total Body Water % 65.6 62.8  Muscle-Mass lbs 31.9 33.8  BMI 24.9 26.3  Body Fat Displacement           Torso  lbs 16.1 20.1         Left Leg  lbs 3.2 4.0         Right Leg  lbs 3.2 4.0         Left Arm  lbs 1.6 2.0         Right Arm  lbs 1.6 2.0      Estimated daily fluid intake: 90 oz Supplements: Fish oil, D3, B-Complex Sleep: Better while medicated Stress / self-care: GAD, recurrent MDD Current average weekly physical activity: Goes to the gym 3-4 days, 60 minutes. Cardio/resistance  24-Hr Dietary Recall First Meal: Breakfast shake  Snack: Nutrigrain  bar Second Meal: none Snack: Gummy bears Third Meal: Wendy's burger, fries, water Snack: 2 Toaster strudels, Cinnamon toast crunch, sweetened almond milk Beverages: water    NUTRITION DIAGNOSIS  NB-1.5 Disordered eating pattern As related to MDD and prediabetes.  As evidenced by A1c of 5.8, self reported weight loss of 10% UBW due to inability to eat resulting from MDD, and self reported tendency to skip meals and binge on sweets..   NUTRITION INTERVENTION  Nutrition education (E-1) on the following topics:  Educated patient on the pathophysiology of diabetes. This includes why our bodies need circulating blood sugar, the relationship between insulin and blood sugar, and the results of insulin resistance and/or pancreatic insufficiency on the development of diabetes. Educated patient on factors that contribute to elevation of blood sugars, such as stress, illness, injury,and food choices. Discussed the role that physical activity plays in lowering blood sugar. Educate patient on the three main macronutrients. Protein, fats, and carbohydrates. Discussed how each of these macronutrients affect blood sugar levels, especially carbohydrate, and the importance of eating a consistent amount of carbohydrate throughout the day.  Educated patient on the balanced plate eating model. Recommended lunch and dinner be 1/2 non-starchy vegetables, 1/4 starches, and 1/4 protein. Recommended breakfast be a balance of starch and protein with a piece  of fruit. Discussed with patient the importance of working towards hitting the proportions of the balanced plate consistently. Counseled patient on ways to begin recognizing each of the food groups from the balanced plate in their own meals, and how close they are to fitting the recommended proportions of the balanced plate. Educated patient on the nutritional value of each food group on the balanced plate model. Counseled patient on allowing themselves to be present in  their emotions when they consider emotional eating. Advised patient to evaluate whether the impulse to eat is hunger based, or emotionally driven.   Handouts Provided Include  Snack Time Balanced Plate  Learning Style & Readiness for Change Teaching method utilized: Visual & Auditory  Demonstrated degree of understanding via: Teach Back  Barriers to learning/adherence to lifestyle change: Recurrent MDD  Goals Established by Pt If you feel symptoms of weakness, tingling in your fingers or faintness, CHECK YOUR BLOOD SUGAR! If it is below 70, eat a small handful of candies, or have 4 oz of soda or fruit juice. Check your blood sugar again after 15 minutes, it should be back over 100.  Try cool whip with some fruit and milk blended up in place of your ice cream every other time. Choose Halo Top ice cream for a replacement as well. Check your blood sugar each morning before you eat. Look for your numbers to stay under 125, and closer to 100.  Work on getting back to the gym 3-4 days a week. Put in that work!  MONITORING & EVALUATION Dietary intake, weekly physical activity, and fasting BG in 1 month.  Next Steps  Patient is to follow up with RD.

## 2021-02-02 NOTE — Patient Instructions (Addendum)
If you feel symptoms of weakness, tingling in your fingers or faintness, CHECK YOUR BLOOD SUGAR! If it is below 70, eat a small handful of candies, or have 4 oz of soda or fruit juice. Check your blood sugar again after 15 minutes, it should be back over 100.   Try cool whip with some fruit and milk blended up in place of your ice cream every other time. Choose Halo Top ice cream for a replacement as well.  Check your blood sugar each morning before you eat. Look for your numbers to stay under 125, and closer to 100.   Work on getting back to the gym 3-4 days a week. Put in that work!

## 2021-02-03 NOTE — Telephone Encounter (Signed)
Patient informed Clinical research associate that he feels like mirtazapine has mellowed him out.  He notes at times he does feel detached and anxious but reports that overall he can cope with it.  He notes that exercising helps his mental condition and reports that he will try to get in the gym soon.  At this time no medication changes made.  Patient agreeable to medications as prescribed

## 2021-02-09 ENCOUNTER — Ambulatory Visit: Payer: 59 | Admitting: Cardiology

## 2021-02-11 ENCOUNTER — Encounter (INDEPENDENT_AMBULATORY_CARE_PROVIDER_SITE_OTHER): Payer: Self-pay | Admitting: Nurse Practitioner

## 2021-02-11 ENCOUNTER — Ambulatory Visit (INDEPENDENT_AMBULATORY_CARE_PROVIDER_SITE_OTHER): Payer: 59 | Admitting: Nurse Practitioner

## 2021-02-11 ENCOUNTER — Other Ambulatory Visit: Payer: Self-pay

## 2021-02-11 VITALS — BP 110/65 | HR 74 | Temp 97.5°F | Ht 69.0 in | Wt 182.6 lb

## 2021-02-11 DIAGNOSIS — F411 Generalized anxiety disorder: Secondary | ICD-10-CM

## 2021-02-11 DIAGNOSIS — R7303 Prediabetes: Secondary | ICD-10-CM

## 2021-02-11 DIAGNOSIS — D696 Thrombocytopenia, unspecified: Secondary | ICD-10-CM | POA: Diagnosis not present

## 2021-02-11 DIAGNOSIS — R059 Cough, unspecified: Secondary | ICD-10-CM | POA: Diagnosis not present

## 2021-02-11 NOTE — Assessment & Plan Note (Signed)
Please continue to follow up with counselor  Continue current medications  Decreased Vision:  Will place refer to optometry - patient to call back after contacting insurance  Thrombocytopenia:  Continue to follow with hematology  Prediabetes:  Please keep upcoming appointment with nutritionist   Cough:  Will check covid test   Follow up:  Follow up with Terrence Baker in 3 months for CPE

## 2021-02-11 NOTE — Progress Notes (Signed)
_0  ID: Terrence Baker, male    DOB: [birth year], 30 y.o.   MRN: [id number]  Chief Complaint  Patient presents with   Establish Care    Referring provider: Camillia Herter, NP  HPI  Patient presents today to establish care with Terrence Mire, NP.  Patient has a past history of anxiety.  He is seeing a counselor currently for this.  Patient does have a history of mild thrombocytopenia and is currently followed by hematology for this issue.  Patient recently had a sleep study and was found not to have any significant sleep apnea.  Patient does have an upcoming appointment scheduled with cardiology.  Patient is requesting a COVID test today.  He states that he has been coughing for the past few days.  He has no known exposures.  Patient is also requesting a referral for optometry due to recent visual changes. Denies f/c/s, n/v/d, hemoptysis, PND, chest pain or edema.     No Known Allergies  Immunization History  Administered Date(s) Administered   Moderna Sars-Covid-2 Vaccination 08/08/2020   Tdap 12/22/2013    Past Medical History:  Diagnosis Date   Anxiety    GERD (gastroesophageal reflux disease)    No pertinent past medical history     Tobacco History: Social History   Tobacco Use  Smoking Status Never  Smokeless Tobacco Never   Counseling given: Yes   Outpatient Encounter Medications as of 02/11/2021  Medication Sig   Lancets (FREESTYLE) lancets Use as instructed   mirtazapine (REMERON) 15 MG tablet Take 1 tablet (15 mg total) by mouth at bedtime.   sucralfate (CARAFATE) 1 g tablet Take 1 tablet (1 g total) by mouth 4 (four) times daily -  with meals and at bedtime for 14 days. (Patient not taking: Reported on 11/24/2020)   [DISCONTINUED] Blood Glucose Monitoring Suppl (FREESTYLE LITE) w/Device KIT 1 Device by Does not apply route in the morning, at noon, in the evening, and at bedtime.   [DISCONTINUED] Continuous Blood Gluc Receiver (FREESTYLE LIBRE READER) DEVI  Use as directed   [DISCONTINUED] glucose blood (FREESTYLE LITE) test strip Use as instructed   No facility-administered encounter medications on file as of 02/11/2021.     Review of Systems  Review of Systems  Constitutional: Negative.  Negative for fever.  HENT: Negative.    Respiratory:  Positive for cough. Negative for shortness of breath.   Cardiovascular: Negative.   Gastrointestinal: Negative.   Allergic/Immunologic: Negative.   Neurological: Negative.   Psychiatric/Behavioral:  The patient is nervous/anxious.       Physical Exam  BP 110/65 (BP Location: Right Arm, Patient Position: Sitting, Cuff Size: Normal)   Pulse 74   Temp (!) 97.5 F (36.4 C) (Temporal)   Ht _1  (1.753 m)   Wt 182 lb 9.6 oz (82.8 kg)   SpO2 97%   BMI 26.97 kg/m   Wt Readings from Last 5 Encounters:  02/11/21 182 lb 9.6 oz (82.8 kg)  02/02/21 180 lb (81.6 kg)  12/31/20 170 lb (77.1 kg)  12/24/20 171 lb 6.4 oz (77.7 kg)  12/02/20 154 lb (69.9 kg)     Physical Exam Vitals and nursing note reviewed.  Constitutional:      General: He is not in acute distress.    Appearance: He is well-developed.  Cardiovascular:     Rate and Rhythm: Normal rate and regular rhythm.  Pulmonary:     Effort: Pulmonary effort is normal.     Breath sounds: Normal breath sounds.  Skin:    General: Skin is warm and dry.  Neurological:     Mental Status: He is alert and oriented to person, place, and time.       Assessment & Plan:   Generalized anxiety disorder Please continue to follow up with counselor  Continue current medications  Decreased Vision:  Will place refer to optometry - patient to call back after contacting insurance  Thrombocytopenia:  Continue to follow with hematology  Prediabetes:  Please keep upcoming appointment with nutritionist   Cough:  Will check covid test   Follow up:  Follow up with Terrence Baker in 3 months for CPE     Terrence Foy,  NP 02/11/2021

## 2021-02-11 NOTE — Patient Instructions (Addendum)
Anxiety:  Please continue to follow up with counselor  Continue current medications  Decreased Vision:  Will place refer to optometry - patient to call back after contacting insurance  Thrombocytopenia:  Continue to follow with hematology  Prediabetes:  Please keep upcoming appointment with nutritionist   Cough:  Will check covid test   Follow up:  Follow up with Terrence Baker in 3 months for CPE

## 2021-02-12 LAB — SARS-COV-2, NAA 2 DAY TAT

## 2021-02-12 LAB — NOVEL CORONAVIRUS, NAA: SARS-CoV-2, NAA: NOT DETECTED

## 2021-02-13 ENCOUNTER — Encounter (INDEPENDENT_AMBULATORY_CARE_PROVIDER_SITE_OTHER): Payer: Self-pay

## 2021-02-17 ENCOUNTER — Ambulatory Visit (HOSPITAL_COMMUNITY): Payer: No Payment, Other | Admitting: Clinical

## 2021-02-17 ENCOUNTER — Other Ambulatory Visit: Payer: Self-pay

## 2021-03-24 ENCOUNTER — Encounter: Payer: 59 | Attending: Internal Medicine | Admitting: Dietician

## 2021-03-24 ENCOUNTER — Encounter: Payer: Self-pay | Admitting: Dietician

## 2021-03-24 ENCOUNTER — Encounter: Payer: Self-pay | Admitting: Physician Assistant

## 2021-03-24 ENCOUNTER — Ambulatory Visit (INDEPENDENT_AMBULATORY_CARE_PROVIDER_SITE_OTHER): Payer: 59 | Admitting: Physician Assistant

## 2021-03-24 ENCOUNTER — Other Ambulatory Visit: Payer: Self-pay

## 2021-03-24 VITALS — BP 116/70 | HR 68 | Ht 68.0 in | Wt 190.2 lb

## 2021-03-24 DIAGNOSIS — K219 Gastro-esophageal reflux disease without esophagitis: Secondary | ICD-10-CM | POA: Diagnosis not present

## 2021-03-24 DIAGNOSIS — R945 Abnormal results of liver function studies: Secondary | ICD-10-CM

## 2021-03-24 DIAGNOSIS — R0789 Other chest pain: Secondary | ICD-10-CM

## 2021-03-24 DIAGNOSIS — R7303 Prediabetes: Secondary | ICD-10-CM | POA: Diagnosis not present

## 2021-03-24 DIAGNOSIS — R7989 Other specified abnormal findings of blood chemistry: Secondary | ICD-10-CM

## 2021-03-24 DIAGNOSIS — R61 Generalized hyperhidrosis: Secondary | ICD-10-CM

## 2021-03-24 DIAGNOSIS — R002 Palpitations: Secondary | ICD-10-CM

## 2021-03-24 NOTE — Patient Instructions (Signed)
Medication Instructions:  Your physician recommends that you continue on your current medications as directed. Please refer to the Current Medication list given to you today.  *If you need a refill on your cardiac medications before your next appointment, please call your pharmacy*  Lab Work: Your physician recommends that you return for lab work TODAY:  Hemoglobin A1c CMET H. Pylori Breath Test   If you have labs (blood work) drawn today and your tests are completely normal, you will receive your results only by: MyChart Message (if you have MyChart) OR A paper copy in the mail If you have any lab test that is abnormal or we need to change your treatment, we will call you to review the results.  Testing/Procedures: NONE ordered at this time of appointment   Follow-Up: At [facility], you and your health needs are our priority.  As part of our continuing mission to provide you with exceptional heart care, we have created designated Provider Care Teams.  These Care Teams include your primary Cardiologist (physician) and Advanced Practice Providers (APPs -  Physician Assistants and Nurse Practitioners) who all work together to provide you with the care you need, when you need it.  We recommend signing up for the patient portal called "MyChart".  Sign up information is provided on this After Visit Summary.  MyChart is used to connect with patients for Virtual Visits (Telemedicine).  Patients are able to view lab/test results, encounter notes, upcoming appointments, etc.  Non-urgent messages can be sent to your provider as well.   To learn more about what you can do with MyChart, go to [email or web address].    Your next appointment:   6 month(s)  The format for your next appointment:   In Person  Provider:   Epifanio Lesches, MD  Other Instructions

## 2021-03-24 NOTE — Patient Instructions (Addendum)
Choose the cafeteria more often for food options instead of snacking on food items from the convenience store.  Continue to work on reducing your stress. Think about the events or triggers that lead up to the emotional state in which you find yourself   Have a Quest protein bar, or Ensure protein supplement drink for breakfast   Consider starting an Omega 3 supplement.  Choose "Steam in Bag" vegetables for quick and easy ways to eat veggies.

## 2021-03-24 NOTE — Progress Notes (Signed)
Cardiology Office Note:    Date:  03/26/2021   ID:  Terrence Baker, DOB August 11, 1990, MRN [id number]  PCP:  Grayce Sessions, NP   [facility] Providers Cardiologist:  Little Ishikawa, MD     Referring MD: Rema Fendt, NP   Chief Complaint  Patient presents with   Follow-up    Seen for Dr. Bjorn Pippin     History of Present Illness:    Terrence Baker is a 30 y.o. male with a hx of anxiety, prediabetes and GERD.  Hemoglobin A1c was 5.8 on 10/08/2020.  Patient was initially seen by Dr. Bjorn Pippin on 11/05/2020 for atypical chest pain.  He described as left-sided burning pain but can also sometimes be a pinching pain.  This typically occur when he is anxious and during panic attacks.  He has been referred to sleep study.  Subsequent inflammatory marker was negative.  Her heart monitor and ETT were ordered.  ETT obtained on 11/13/2020 was normal, excellent exercise capacity achieving 13.6 METS of activity, no EKG changes.  Abdominal ultrasound obtained on 12/11/2018 was normal.  Patient presents today for follow-up.  His mother was also on the phone listening to the interview as well.  He says anxiety and shakiness he has felt earlier this year has resolved after behavioral health placed him on new medication.  He denies any further chest pain worsening shortness of breath.  Previous chest pain has been atypical and described as a pinching sensation in the anterior chest.  A few days ago, he had onset of pouring sweat without any other associated symptoms.  This is solitary event and has not recurred since.  He denies any recent dizziness or feeling of passing out.  Overall, it sounds like his symptom has improved and I did not recommend any further work-up.  He never had heart monitor done, however it has been several months since he had the shakiness in the chest.  I recommend a repeat hemoglobin A1c to follow-up on the prediabetes.  Mother is also concerned about his acid reflux and the GI  symptom, she was recently diagnosed with H. pylori, I will also order H. pylori test for the patient as well.  I recommend a CMP given the recent elevated liver enzyme.  I have otherwise, he can follow-up with Dr. Bjorn Pippin in 6 months.  Past Medical History:  Diagnosis Date   Anxiety    GERD (gastroesophageal reflux disease)    No pertinent past medical history     Past Surgical History:  Procedure Laterality Date   NO PAST SURGERIES      Current Medications: Current Meds  Medication Sig   Lancets (FREESTYLE) lancets Use as instructed   mirtazapine (REMERON) 15 MG tablet Take 1 tablet (15 mg total) by mouth at bedtime.     Allergies:   Patient has no known allergies.   Social History   Socioeconomic History   Marital status: Single    Spouse name: Not on file   Number of children: Not on file   Years of education: Not on file   Highest education level: Not on file  Occupational History   Not on file  Tobacco Use   Smoking status: Never   Smokeless tobacco: Never  Vaping Use   Vaping Use: Never used  Substance and Sexual Activity   Alcohol use: Never   Drug use: Never   Sexual activity: Not Currently  Other Topics Concern   Not on file  Social History Narrative  Not on file   Social Determinants of Health   Financial Resource Strain: Not on file  Food Insecurity: Not on file  Transportation Needs: Not on file  Physical Activity: Not on file  Stress: Not on file  Social Connections: Not on file     Family History: The patient'sfamily history includes Diabetes in his father.  ROS:   Please see the history of present illness.     All other systems reviewed and are negative.  EKGs/Labs/Other Studies Reviewed:    The following studies were reviewed today:  ETT 11/13/2020 Blood pressure demonstrated a normal response to exercise. There was no ST segment deviation noted during stress. Negative exercise stress test at an adequate heart rate response of  100% MPHR. Excellent exercise capacity achieving 13.6 mets. This is a low risk study.    EKG:  EKG is not ordered today.  T  Recent Labs: 08/11/2020: TSH 1.780 12/31/2020: Hemoglobin 14.4; Platelets 156 03/24/2021: ALT 206; BUN 11; Creatinine, Ser 0.98; Potassium 4.8; Sodium 143  Recent Lipid Panel    Component Value Date/Time   CHOL 177 10/08/2020 1703   TRIG 54 10/08/2020 1703   HDL 57 10/08/2020 1703   CHOLHDL 3.1 10/08/2020 1703   LDLCALC 109 (H) 10/08/2020 1703     Risk Assessment/Calculations:           Physical Exam:    VS:  BP 116/70 (BP Location: Left Arm)   Pulse 68   Ht 5\' 8"  (1.727 m)   Wt 190 lb 3.2 oz (86.3 kg)   SpO2 98%   BMI 28.92 kg/m     Wt Readings from Last 3 Encounters:  03/24/21 190 lb 3.2 oz (86.3 kg)  03/24/21 188 lb 4.8 oz (85.4 kg)  02/11/21 182 lb 9.6 oz (82.8 kg)     GEN:  Well nourished, well developed in no acute distress HEENT: Normal NECK: No JVD; No carotid bruits LYMPHATICS: No lymphadenopathy CARDIAC: RRR, no murmurs, rubs, gallops RESPIRATORY:  Clear to auscultation without rales, wheezing or rhonchi  ABDOMEN: Soft, non-tender, non-distended MUSCULOSKELETAL:  No edema; No deformity  SKIN: Warm and dry NEUROLOGIC:  Alert and oriented x 3 PSYCHIATRIC:  Normal affect   ASSESSMENT:    1. Atypical chest pain   2. Abnormal liver function test   3. Prediabetes   4. Gastroesophageal reflux disease without esophagitis   5. Palpitations   6. Sweating profusely    PLAN:    In order of problems listed above:  Atypical chest pain: EKG obtained in May 2022 was normal.  No further studies needed.  Symptom resolved.  Abnormal liver function: Obtain CMP  Prediabetes: Obtain hemoglobin A1c  Vague GI issue with acid reflux: Mother was recently diagnosed with H. pylori, given his vague GI issue, will proceed with H. pylori work-up  Palpitation: Dr. June 2022 previously ordered him a heart monitor, this was never done.  However it  appears that shakiness feeling in his chest has completely resolved since earlier this year.  We will hold off on additional work-up and continue observation  Diffuse swelling: Solitary episode that occurred recently, he denies any exertional issue.  We will continue for recurrence.          Medication Adjustments/Labs and Tests Ordered: Current medicines are reviewed at length with the patient today.  Concerns regarding medicines are outlined above.  Orders Placed This Encounter  Procedures   Comprehensive metabolic panel   Hemoglobin A1c   H. pylori breath test  No orders of the defined types were placed in this encounter.   Patient Instructions  Medication Instructions:  Your physician recommends that you continue on your current medications as directed. Please refer to the Current Medication list given to you today.  *If you need a refill on your cardiac medications before your next appointment, please call your pharmacy*  Lab Work: Your physician recommends that you return for lab work TODAY:  Hemoglobin A1c CMET H. Pylori Breath Test   If you have labs (blood work) drawn today and your tests are completely normal, you will receive your results only by: MyChart Message (if you have MyChart) OR A paper copy in the mail If you have any lab test that is abnormal or we need to change your treatment, we will call you to review the results.  Testing/Procedures: NONE ordered at this time of appointment   Follow-Up: At [facility], you and your health needs are our priority.  As part of our continuing mission to provide you with exceptional heart care, we have created designated Provider Care Teams.  These Care Teams include your primary Cardiologist (physician) and Advanced Practice Providers (APPs -  Physician Assistants and Nurse Practitioners) who all work together to provide you with the care you need, when you need it.  We recommend signing up for the patient portal  called "MyChart".  Sign up information is provided on this After Visit Summary.  MyChart is used to connect with patients for Virtual Visits (Telemedicine).  Patients are able to view lab/test results, encounter notes, upcoming appointments, etc.  Non-urgent messages can be sent to your provider as well.   To learn more about what you can do with MyChart, go to [email or web address].    Your next appointment:   6 month(s)  The format for your next appointment:   In Person  Provider:   Epifanio Lesches, MD  Other Instructions    Signed, Azalee Course, PA  03/26/2021 11:52 PM    [facility]

## 2021-03-24 NOTE — Progress Notes (Signed)
Medical Nutrition Therapy  Appointment Start time:  1215  Appointment End time:  1440  Primary concerns today: Prediabetes  Referral diagnosis: R73.09 Prediabetes Preferred learning style: No preference indicated Learning readiness: Contemplating   NUTRITION ASSESSMENT   Anthropometrics  Ht: 5'9" Wt: 188.3 lbs  Body mass index is 27.81 kg/m.   Clinical Medical Hx: GAD, MDD, Low platelets Medications: Mirtazapine Labs: A1c - 5.8 Notable Signs/Symptoms: Anxious disposition   Lifestyle & Dietary Hx Abridged appointment, pt 15 minutes late. Pt called mother during appointment to listen to appointment Pt reports starting an new job at [employer], states they are tempted by the foods they are around. Pt reports just starting to be consistent with exercise, but is having difficulty finding time to work with their trainer. Pt has been working on stress reduction techniques. Pt reports binge eating more. Pt is not eating breakfast anymore, tends to snack a lot more in the evening.   Body Composition Scale Date 12/24/20  02/02/2021  03/24/2021  Current Body Weight 170.3 lbs 180.0 188.3 lbs  Total Body Fat % 15.3 18.1 20.2  Visceral Fat 8 10 11   Fat-Free Mass % 84.6 81.8 79.7   Total Body Water % 65.6 62.8 60.7  Muscle-Mass lbs 31.9 33.8 35.3  BMI 24.9 26.3 27.5  Body Fat Displacement            Torso  lbs 16.1 20.1 23.6         Left Leg  lbs 3.2 4.0 4.7         Right Leg  lbs 3.2 4.0 4.7         Left Arm  lbs 1.6 2.0 2.3         Right Arm  lbs 1.6 2.0 2.3      Estimated daily fluid intake: 90 oz Supplements: Fish oil, D3, B-Complex Sleep: Better while medicated Stress / self-care: GAD, recurrent MDD Current average weekly physical activity: Goes to the gym 3-4 days, 60 minutes. Cardio/resistance  24-Hr Dietary Recall Unable to record due to shortened appointment First Meal:  Snack:  Second Meal:  Snack:  Third Meal:  Snack:  Beverages:     NUTRITION  DIAGNOSIS  NB-1.5 Disordered eating pattern As related to MDD and prediabetes.  As evidenced by A1c of 5.8, self reported weight loss of 10% UBW due to inability to eat resulting from MDD, and self reported tendency to skip meals and binge on sweets..   NUTRITION INTERVENTION  Nutrition education (E-1) on the following topics:  Educated patient on the pathophysiology of diabetes. This includes why our bodies need circulating blood sugar, the relationship between insulin and blood sugar, and the results of insulin resistance and/or pancreatic insufficiency on the development of diabetes. Educated patient on factors that contribute to elevation of blood sugars, such as stress, illness, injury,and food choices. Discussed the role that physical activity plays in lowering blood sugar. Educate patient on the three main macronutrients. Protein, fats, and carbohydrates. Discussed how each of these macronutrients affect blood sugar levels, especially carbohydrate, and the importance of eating a consistent amount of carbohydrate throughout the day.  Educated patient on the balanced plate eating model. Recommended lunch and dinner be 1/2 non-starchy vegetables, 1/4 starches, and 1/4 protein. Recommended breakfast be a balance of starch and protein with a piece of fruit. Discussed with patient the importance of working towards hitting the proportions of the balanced plate consistently. Counseled patient on ways to begin recognizing each of the food  groups from the balanced plate in their own meals, and how close they are to fitting the recommended proportions of the balanced plate. Educated patient on the nutritional value of each food group on the balanced plate model. Counseled patient on allowing themselves to be present in their emotions when they consider emotional eating. Advised patient to evaluate whether the impulse to eat is hunger based, or emotionally driven.   Handouts Provided Include  Snack  Time Balanced Plate  Learning Style & Readiness for Change Teaching method utilized: Visual & Auditory  Demonstrated degree of understanding via: Teach Back  Barriers to learning/adherence to lifestyle change: Recurrent MDD  Goals Established by Pt Choose the cafeteria more often for food options instead of snacking on food items from the convenience store. Continue to work on reducing your stress. Think about the events or triggers that lead up to the emotional state in which you find yourself  Have a Quest protein bar, or Ensure protein supplement drink for breakfast  Consider starting an Omega 3 supplement. Choose "Steam in Bag" vegetables for quick and easy ways to eat veggies.  MONITORING & EVALUATION Dietary intake, weekly physical activity, and fasting BG in 1 month.  Next Steps  Patient is to follow up with RD.

## 2021-03-25 LAB — HEMOGLOBIN A1C
Est. average glucose Bld gHb Est-mCnc: 120 mg/dL
Hgb A1c MFr Bld: 5.8 % — ABNORMAL HIGH (ref 4.8–5.6)

## 2021-03-25 LAB — COMPREHENSIVE METABOLIC PANEL
ALT: 206 IU/L — ABNORMAL HIGH (ref 0–44)
AST: 85 IU/L — ABNORMAL HIGH (ref 0–40)
Albumin/Globulin Ratio: 1.8 (ref 1.2–2.2)
Albumin: 4.7 g/dL (ref 4.1–5.2)
Alkaline Phosphatase: 58 IU/L (ref 44–121)
BUN/Creatinine Ratio: 11 (ref 9–20)
BUN: 11 mg/dL (ref 6–20)
Bilirubin Total: 0.2 mg/dL (ref 0.0–1.2)
CO2: 24 mmol/L (ref 20–29)
Calcium: 10.1 mg/dL (ref 8.7–10.2)
Chloride: 102 mmol/L (ref 96–106)
Creatinine, Ser: 0.98 mg/dL (ref 0.76–1.27)
Globulin, Total: 2.6 g/dL (ref 1.5–4.5)
Glucose: 69 mg/dL (ref 65–99)
Potassium: 4.8 mmol/L (ref 3.5–5.2)
Sodium: 143 mmol/L (ref 134–144)
Total Protein: 7.3 g/dL (ref 6.0–8.5)
eGFR: 108 mL/min/{1.73_m2} (ref >59)

## 2021-03-26 ENCOUNTER — Encounter: Payer: Self-pay | Admitting: Physician Assistant

## 2021-03-27 ENCOUNTER — Telehealth (HOSPITAL_COMMUNITY): Payer: Self-pay | Admitting: Psychiatry

## 2021-03-27 ENCOUNTER — Telehealth: Payer: Self-pay | Admitting: Cardiology

## 2021-03-27 ENCOUNTER — Telehealth: Payer: Self-pay | Admitting: Registered"

## 2021-03-27 ENCOUNTER — Encounter: Payer: Self-pay | Admitting: Registered"

## 2021-03-27 ENCOUNTER — Telehealth (HOSPITAL_COMMUNITY): Payer: Self-pay | Admitting: *Deleted

## 2021-03-27 ENCOUNTER — Other Ambulatory Visit (HOSPITAL_COMMUNITY): Payer: Self-pay | Admitting: Psychiatry

## 2021-03-27 DIAGNOSIS — F411 Generalized anxiety disorder: Secondary | ICD-10-CM

## 2021-03-27 MED ORDER — GABAPENTIN 100 MG PO CAPS: 100.0000 mg | ORAL_CAPSULE | Freq: Three times a day (TID) | ORAL | 2 refills | Status: AC

## 2021-03-27 NOTE — Telephone Encounter (Signed)
Patient calling to speak to his provider "urgently" regarding recent medical issues and cardiology work-ups.

## 2021-03-27 NOTE — Telephone Encounter (Signed)
Pt called and left message requesting a call back. Pt sees Hewitt Blade, RDN, LDN who is currently out of office. Pt reports he needs to talk with someone urgently before Michele Mcalpine returns. Dietitian returned call to f/u regarding pt's concerns. Pt and mother on phone line. Pt reports he just heard back from Cardiology regarding highly elevated liver enzymes which are suspected to be elevated due to a medication pt is taking for anxiety. Mother reports pt will be discontinuing this medication and psychiatry has been contacted regarding medication change. Reports they will be having labs rechecked again in 2 weeks. Mother and pt want to know if there are changes in pt's nutrition recommendations due to new information. Dietitian encouraged pt to continue with plan discussed with his dietitian earlier this week of eating balanced nutrition consistently. Discussed continuing to limit high fat foods, especially those high in saturated and trans fats and discussed sources. Encouraged pt and mother not to stress as they are taking steps to improve this concern. Mother reports pt is also being tested for H pylori and she will follow up with office regarding those results. Pt and mother report no further questions or concerns at this time. Encouraged they may call office if further questions.

## 2021-03-27 NOTE — Telephone Encounter (Signed)
Work note for for patient was typed by me Dorris Fetch, CMA) and mailed to patient who confirmed mailing address in system was correct.

## 2021-03-27 NOTE — Telephone Encounter (Signed)
Patient and mom called urgently concerned with his recent lab results, most of the focus is on the elevated liver enzymes, mom reported the Dr said they are three times higher than they are supposed to be and she believes its related to the Remeron we are prescribing. Cardiac issues are not new but the lab work done this week is new and concerning. Will forward this message to Dr Doyne Keel for her consideration and will call him back with any necessary changes.

## 2021-03-27 NOTE — Telephone Encounter (Signed)
Patient called to make sure that [facility] mailed out the dr note that he needs for work. Please advise

## 2021-03-27 NOTE — Telephone Encounter (Signed)
Provider spoke to patient and his mother who are concerned because patient has elevated liver enzymes. His A1C levels are also elevated. Patient complained of feeling fatigued. Provider informed patient and his mother that he may be experiencing metabolic syndrome and recommended discontinuing mirtazapine. Patient reported that he was hesitant because he found mirtazapine effective but noted that he wanted to do what was best for his health. Provider recommended starting a lower dose of Gabapentin 100 mg three times daily to help manage anxiety. Labs will be redrawn on patients next visit 04/06/2021. No other concerns noted at this time.

## 2021-03-27 NOTE — Telephone Encounter (Signed)
See previous not for more detail. Mirtazapine discontinued. Gabapentin 100 mg three times daily started to help manage anxiety. Patient will have repeat labs drawn at next visit.

## 2021-03-27 NOTE — Telephone Encounter (Signed)
Routed to primary CMA to advise as I do not see a letter/note from [place] [facility]

## 2021-03-27 NOTE — Progress Notes (Signed)
I agree that his liver enzymes may be elevated due to the mirtazapine. He may be experiencing early signs of metabolic syndrome as evident by elevated A1C and elevated liver enzymes. Provide recommended that he discontinue mirtazapine as well. A low does of Gabapentin 100 mg three times daily was started to help manage his anxiety. Labs will be redrawn at patient next visit (04/06/2021). Patient was agreeable to theses changes. Provider recommended that he follow up with his PCP for further evaluation.

## 2021-03-28 LAB — H. PYLORI BREATH TEST: H pylori Breath Test: NEGATIVE

## 2021-03-30 ENCOUNTER — Other Ambulatory Visit: Payer: Self-pay

## 2021-03-30 ENCOUNTER — Telehealth: Payer: Self-pay | Admitting: Physician Assistant

## 2021-03-30 DIAGNOSIS — R7401 Elevation of levels of liver transaminase levels: Secondary | ICD-10-CM

## 2021-03-30 NOTE — Progress Notes (Signed)
H pylori breath test was negative. Other lab result was addressed last week in a separate phone message

## 2021-03-30 NOTE — Telephone Encounter (Signed)
I spoke with both Terrence Baker and his mother, they are very concerned about recent elevated transaminase level noted on the last blood work.  He has not been able to get in with his usual PCP.  At his mother's request, we have referred the patient to a different internal medicine physician.  I forwarded the lab result to both his PCP and psychiatry last week, per psychiatry, they also suspect it was the Remeron that caused elevation of the liver enzyme.  I explained that additional work-up will need to be performed by primary care provider, however both the patient and the mother are quite concerned that this will further delay the work-up.  He has a pending liver function test next week, I we will also obtain urgent right upper quadrant ultrasound as well.

## 2021-03-31 ENCOUNTER — Ambulatory Visit
Admission: RE | Admit: 2021-03-31 | Discharge: 2021-03-31 | Disposition: A | Discharge status: Home / Self Care | Payer: 59 | Source: Ambulatory Visit | Attending: Physician Assistant | Admitting: Physician Assistant

## 2021-03-31 ENCOUNTER — Other Ambulatory Visit: Payer: Self-pay | Admitting: Physician Assistant

## 2021-03-31 DIAGNOSIS — R7401 Elevation of levels of liver transaminase levels: Secondary | ICD-10-CM

## 2021-03-31 DIAGNOSIS — K769 Liver disease, unspecified: Secondary | ICD-10-CM

## 2021-03-31 NOTE — Progress Notes (Signed)
Spoke with the patient on the phone, explained that there is a liver lesion of unknown significance, will need MRI of abdomen with and without contrast to further assess.

## 2021-03-31 NOTE — Progress Notes (Signed)
I spoke with radiologist regarding MRI of abdomen vs MRI of liver, given his nonspecific GI concerns on top of liver lesion, will order MRI of abdomen with and without contrast, I placed stat order for the study

## 2021-04-06 ENCOUNTER — Telehealth (HOSPITAL_COMMUNITY): Payer: No Payment, Other | Admitting: Psychiatry

## 2021-04-06 ENCOUNTER — Other Ambulatory Visit: Payer: Self-pay

## 2021-04-09 ENCOUNTER — Ambulatory Visit (HOSPITAL_COMMUNITY)
Admission: RE | Admit: 2021-04-09 | Discharge: 2021-04-09 | Disposition: A | Discharge status: Home / Self Care | Payer: Self-pay | Source: Ambulatory Visit | Attending: Physician Assistant | Admitting: Physician Assistant

## 2021-04-09 ENCOUNTER — Other Ambulatory Visit: Payer: Self-pay

## 2021-04-09 DIAGNOSIS — K769 Liver disease, unspecified: Secondary | ICD-10-CM | POA: Insufficient documentation

## 2021-04-09 MED ORDER — GADOBUTROL 1 MMOL/ML IV SOLN
9.0000 mL | Freq: Once | INTRAVENOUS | Status: AC | PRN
  Administered 2021-04-09: 9 mL via INTRAVENOUS

## 2021-04-09 NOTE — Progress Notes (Signed)
Reassuring MRI result, no sign of liver mass noted on MRI which is a more sensitive study compare to ultrasound. Rest of the organ in the abdomen were also normal. The MRI image was high enough to also see a portion of the lower chest, no abnormality in the lower chest either. Overall, this is a completely normal MRI result. Continue with the previous plan of repeating liver enzyme in the next 1-2 weeks to make sure the liver enzyme is coming back down to normal. Also make sure follow up with primary care physician as well.

## 2021-04-15 ENCOUNTER — Other Ambulatory Visit: Payer: Self-pay

## 2021-04-15 DIAGNOSIS — R7989 Other specified abnormal findings of blood chemistry: Secondary | ICD-10-CM

## 2021-04-15 DIAGNOSIS — Z79899 Other long term (current) drug therapy: Secondary | ICD-10-CM

## 2021-04-15 DIAGNOSIS — K769 Liver disease, unspecified: Secondary | ICD-10-CM

## 2021-04-16 ENCOUNTER — Telehealth: Payer: Self-pay | Admitting: Physician Assistant

## 2021-04-16 ENCOUNTER — Encounter (INDEPENDENT_AMBULATORY_CARE_PROVIDER_SITE_OTHER): Payer: Self-pay | Admitting: Primary Care

## 2021-04-16 ENCOUNTER — Telehealth (HOSPITAL_COMMUNITY): Payer: Self-pay | Admitting: Psychiatry

## 2021-04-16 DIAGNOSIS — K769 Liver disease, unspecified: Secondary | ICD-10-CM

## 2021-04-16 DIAGNOSIS — Z79899 Other long term (current) drug therapy: Secondary | ICD-10-CM

## 2021-04-16 DIAGNOSIS — R7989 Other specified abnormal findings of blood chemistry: Secondary | ICD-10-CM

## 2021-04-16 NOTE — Telephone Encounter (Signed)
Returned call to patient's mother. She states patient is having hazy vision which they believe is one of the symptoms of liver problems. She states patient was told by PCP that he could not be seen until January; she is requesting that Azalee Course reach out to PCP for sooner appointment. I asked if patient has repeated his liver function test and she denies. I advised her to have patient come early tomorrow for repeat LFT and that I will forward message to [facility] for follow-up on 10/14. She verbalized understanding and agreement and thanked me for the call.

## 2021-04-16 NOTE — Telephone Encounter (Signed)
Patient called requesting to have a message sent to provider. He needs to speak with someone about his medications, no further details. Writer noticed pt hadn't been seen since July and offered to reschedule. Patient became snappy and ended conversation. No upcoming appointments were scheduled.

## 2021-04-16 NOTE — Telephone Encounter (Signed)
Terrence Baker is calling requesting to speak with Terrence Baker about his recent results from the hospital. He states the best callback number is his mothers and he will be awaiting a callback.

## 2021-04-17 LAB — HEPATIC FUNCTION PANEL
ALT: 156 IU/L — ABNORMAL HIGH (ref 0–44)
AST: 79 IU/L — ABNORMAL HIGH (ref 0–40)
Albumin: 4.3 g/dL (ref 4.1–5.2)
Alkaline Phosphatase: 41 IU/L — ABNORMAL LOW (ref 44–121)
Bilirubin Total: 0.4 mg/dL (ref 0.0–1.2)
Bilirubin, Direct: <0.1 mg/dL (ref 0.00–0.40)
Total Protein: 6.4 g/dL (ref 6.0–8.5)

## 2021-04-17 NOTE — Telephone Encounter (Signed)
Patient informed Clinical research associate that he has not started Gabapentin and wants specific times as to when he should take it. Provider encouraged patient to take one in morning, afternoon, and night. Provider noted that if it makes him sleepy that he can take two pills at night and one in the afternoon prior to him getting off from work. He endorsed understanding and agreed. No other concerns noted at this time,.

## 2021-04-20 NOTE — Telephone Encounter (Signed)
I have called and spoke with both the patient and his mother regarding the lab results. Recent lab reassuring, will defer to PCP to repeat LFT when she sees him in January. Mother mentions the patient complained of occasional vision issue, I am not aware of a direct correlation between elevated liver enzyme and vision. Will defer additional work up to [employer] office during next appt.

## 2021-04-20 NOTE — Telephone Encounter (Signed)
Unfortunately, I do not have control over PCP's schedule. Based on the Epic phone note on 10/13, it appears his PCP requested their office to make a first available slot for Terrence Baker. If that first available slot is Jan, then I don't have a way to move it up sooner. The reassuring part is recent work up including MRI of abdomen did not show acute issue. My suspicion is still medical liver disease due to the antianxiety medication that [name] was taking which we have stopped. His last liver enzyme shows the lab work is trending back down to the previous baseline which is a good. My suspicion is his PCP will repeat a liver enzyme when she sees him by which point his liver enzyme should look even better.

## 2021-04-27 ENCOUNTER — Telehealth (HOSPITAL_COMMUNITY): Payer: Self-pay | Admitting: Psychiatry

## 2021-04-27 NOTE — Telephone Encounter (Signed)
Patient requested for provider to contact him or his mother regarding medication recently prescribed. No further information given.

## 2021-04-28 ENCOUNTER — Encounter: Payer: Self-pay | Admitting: Dietician

## 2021-04-28 ENCOUNTER — Other Ambulatory Visit: Payer: Self-pay

## 2021-04-28 ENCOUNTER — Encounter: Payer: Self-pay | Attending: Internal Medicine | Admitting: Dietician

## 2021-04-28 DIAGNOSIS — R748 Abnormal levels of other serum enzymes: Secondary | ICD-10-CM | POA: Insufficient documentation

## 2021-04-28 DIAGNOSIS — R7303 Prediabetes: Secondary | ICD-10-CM | POA: Insufficient documentation

## 2021-04-28 NOTE — Progress Notes (Signed)
Medical Nutrition Therapy  Appointment Start time:  [phone]  Appointment End time:  0940  Primary concerns today: Elevated Liver Enzymes Referral diagnosis: R73.09 Prediabetes Preferred learning style: No preference indicated Learning readiness: Contemplating   NUTRITION ASSESSMENT   Anthropometrics  Ht: 5'9" Wt: 188.3 lbs  Body mass index is 28 kg/m.   Clinical Medical Hx: GAD, MDD, Low platelets, NEW: Liver Disease Medications: Mirtazapine (Discontinued) Labs: A1c - 5.8 NEW: ALT - 79 (high), AST -156 (high), Alkaline Phosphatase - 41 (low) Notable Signs/Symptoms: Anxious disposition   Lifestyle & Dietary Hx Abridged appointment, pt 15 minutes late. Pt called mother during appointment to listen to appointment Pt reports being diagnosed with potential liver disease as a result of large doses of anxiety medication. Pt has discontinued the medication. Pt underwent MRI that did not reveal any abnormalities. Pt contacted [facility] by phone upon diagnosis. Noel Journey, RDN, LDN, MS gave pt advice on appropriate dietary practices for liver health. Pt was also tested for H. Pylori, results were negative.  Pt has been exercising consistently. Pt is working on limiting their fat intake, and limiting snacking.  Pt reports feeling better overall.  Body Composition Scale Date 12/24/20  02/02/2021  03/24/2021  04/28/2021  Current Body Weight 170.3 lbs 180.0 188.3 lbs 189.6 lbs  Total Body Fat % 15.3 18.1 20.2 20.5   Visceral Fat 8 10 11 11   Fat-Free Mass % 84.6 81.8 79.7 79.4   Total Body Water % 65.6 62.8 60.7 60.4  Muscle-Mass lbs 31.9 33.8 35.3 35.6  BMI 24.9 26.3 27.5 27.7  Body Fat Displacement             Torso  lbs 16.1 20.1 23.6 24.0         Left Leg  lbs 3.2 4.0 4.7 4.8         Right Leg  lbs 3.2 4.0 4.7 4.8         Left Arm  lbs 1.6 2.0 2.3 2.4         Right Arm  lbs 1.6 2.0 2.3 2.4      Estimated daily fluid intake: 90 oz Supplements: Fish oil, D3, B-Complex Sleep:  Better while medicated Stress / self-care: GAD, recurrent MDD Current average weekly physical activity: Goes to the gym 3-4 days, 60 minutes. Cardio/resistance  24-Hr Dietary Recall First Meal: Fruit smoothie, whey protein, greek yogurt, milk Snack: Small bag of chips, pear, water Second Meal: Baked chicken thighs, rice, potatoes, cabbage Snack:  Third Meal:  Snack:  Beverages: water    NUTRITION DIAGNOSIS  NB-1.5 Disordered eating pattern As related to MDD and prediabetes.  As evidenced by A1c of 5.8, self reported weight loss of 10% UBW due to inability to eat resulting from MDD, and self reported tendency to skip meals and binge on sweets..   NUTRITION INTERVENTION  Nutrition education (E-1) on the following topics:  Educated patient on the pathophysiology of diabetes. This includes why our bodies need circulating blood sugar, the relationship between insulin and blood sugar, and the results of insulin resistance and/or pancreatic insufficiency on the development of diabetes. Educated patient on factors that contribute to elevation of blood sugars, such as stress, illness, injury,and food choices. Discussed the role that physical activity plays in lowering blood sugar. Educate patient on the three main macronutrients. Protein, fats, and carbohydrates. Discussed how each of these macronutrients affect blood sugar levels, especially carbohydrate, and the importance of eating a consistent amount of carbohydrate throughout the day.  Educated  patient on the balanced plate eating model. Recommended lunch and dinner be 1/2 non-starchy vegetables, 1/4 starches, and 1/4 protein. Recommended breakfast be a balance of starch and protein with a piece of fruit. Discussed with patient the importance of working towards hitting the proportions of the balanced plate consistently. Counseled patient on ways to begin recognizing each of the food groups from the balanced plate in their own meals, and how close  they are to fitting the recommended proportions of the balanced plate. Educated patient on the nutritional value of each food group on the balanced plate model. Counseled patient on allowing themselves to be present in their emotions when they consider emotional eating. Advised patient to evaluate whether the impulse to eat is hunger based, or emotionally driven. NEW: Educated patient on appropriate nutrition guidelines for liver health. This includes a low fat diet, especially in saturated and trans fats, low concentrated sugars and SSB, lean proteins, and high fiber items like fruits, vegetables, legumes, and whole grains. Emphasized that patient continue their physical activity regiment and minimize weight gain.   Handouts Provided Include  Snack Time Balanced Plate NEW: Fiber List  Learning Style & Readiness for Change Teaching method utilized: Visual & Auditory  Demonstrated degree of understanding via: Teach Back  Barriers to learning/adherence to lifestyle change: Recurrent MDD  Goals Established by Pt Continue to follow nutrition guidelines to limit fats, especially saturated and trans fats, choose lean proteins, avoid processed meats, choose low sugar snacks. Keep up the great work with your exercise. Try to choose high fiber items like bran muffins, fruits and vegetables, whole grains, and oatmeal. Consult your fiber handout for good options. Choose Fairlife brand low fat milk for a good source of protein with low sugar.  MONITORING & EVALUATION Dietary intake, weekly physical activity, and liver enzymes in 1 month.  Next Steps  Patient is to follow up with RD.

## 2021-04-28 NOTE — Patient Instructions (Addendum)
Continue to follow nutrition guidelines to limit fats, especially saturated and trans fats, choose lean proteins, avoid processed meats, choose low sugar snacks.  Keep up the great work with your exercise.  Try to choose high fiber items like bran muffins, fruits and vegetables, whole grains, and oatmeal. Consult your fiber handout for good options.  Choose Fairlife brand low fat milk for a good source of protein with low sugar.

## 2021-04-29 NOTE — Telephone Encounter (Signed)
Patient notes that he finds gabapentin effective. He notes he has more energy and is less anxious. He informed Clinical research associate that his liver enzymes levels has reduced. Patient informed Clinical research associate that he does still have brain fog but reports overall he is well. Provider spoke to patient mother who notes that he was restless at night after stopping Mirtazapine and informed him to take one gabapentin at day and two at night. She reports that she feels that the Gabapentin has been effective and notes that he is less suppressed and more energized. She also notes that he may want to restart a lower dose of Mirtazapine. At this time provider reccommended staying off Mirtazapine.

## 2021-05-01 ENCOUNTER — Encounter (HOSPITAL_COMMUNITY): Payer: Self-pay | Admitting: Psychiatry

## 2021-05-01 ENCOUNTER — Telehealth (HOSPITAL_COMMUNITY): Payer: Self-pay | Admitting: *Deleted

## 2021-05-01 NOTE — Telephone Encounter (Signed)
Patient notes that he has a panic attack last night, snappy, and jumpy. He notes that this behaviors happens infrequently. He notes that the last time he experienced this he was coming off of mirtazapine. He notes that his job has been stressful, he has persona issues with family, and is stressed due to homecoming. He notes that he feels mentally foggy and reports that his words are slurred.  At this time provider reccommended not increasing his medication. Provider referred patient for therapy. Provider gave patient walk-in hours for therapist and scheduled him for 07/15/2021. No other concerns noted at this time.

## 2021-05-01 NOTE — Telephone Encounter (Signed)
Patient was calling to speak with Terrence Baker to discuss how he is feeling right now. Patient would like for meng to give him a call back asap Patient advise

## 2021-05-01 NOTE — Telephone Encounter (Signed)
Call this am from Terrence Baker wanting to speak with his Dr because he is having side affects and doesn't feel good or right. Will notify Dr Doyne Keel of his request.

## 2021-05-05 NOTE — Telephone Encounter (Signed)
I called the patient last Friday. His questions answered.

## 2021-05-14 ENCOUNTER — Encounter (INDEPENDENT_AMBULATORY_CARE_PROVIDER_SITE_OTHER): Payer: 59 | Admitting: Primary Care

## 2021-05-19 ENCOUNTER — Ambulatory Visit: Payer: Self-pay | Admitting: Dietician

## 2021-05-25 ENCOUNTER — Other Ambulatory Visit: Payer: Self-pay | Admitting: Hematology and Oncology

## 2021-05-25 ENCOUNTER — Inpatient Hospital Stay: Payer: Self-pay

## 2021-05-25 ENCOUNTER — Other Ambulatory Visit: Payer: Self-pay

## 2021-05-25 ENCOUNTER — Inpatient Hospital Stay: Payer: Self-pay | Attending: Hematology and Oncology | Admitting: Hematology and Oncology

## 2021-05-25 VITALS — BP 125/78 | HR 72 | Temp 97.2°F | Resp 18 | Wt 192.5 lb

## 2021-05-25 DIAGNOSIS — D696 Thrombocytopenia, unspecified: Secondary | ICD-10-CM

## 2021-05-25 DIAGNOSIS — R531 Weakness: Secondary | ICD-10-CM | POA: Insufficient documentation

## 2021-05-25 LAB — CBC WITH DIFFERENTIAL (CANCER CENTER ONLY)
Abs Immature Granulocytes: 0.01 10*3/uL (ref 0.00–0.07)
Basophils Absolute: 0 10*3/uL (ref 0.0–0.1)
Basophils Relative: 0 %
Eosinophils Absolute: 0.1 10*3/uL (ref 0.0–0.5)
Eosinophils Relative: 1 %
HCT: 43.3 % (ref 39.0–52.0)
Hemoglobin: 14.6 g/dL (ref 13.0–17.0)
Immature Granulocytes: 0 %
Lymphocytes Relative: 38 %
Lymphs Abs: 1.8 10*3/uL (ref 0.7–4.0)
MCH: 28.9 pg (ref 26.0–34.0)
MCHC: 33.7 g/dL (ref 30.0–36.0)
MCV: 85.6 fL (ref 80.0–100.0)
Monocytes Absolute: 0.5 10*3/uL (ref 0.1–1.0)
Monocytes Relative: 11 %
Neutro Abs: 2.4 10*3/uL (ref 1.7–7.7)
Neutrophils Relative %: 50 %
Platelet Count: 136 10*3/uL — ABNORMAL LOW (ref 150–400)
RBC: 5.06 MIL/uL (ref 4.22–5.81)
RDW: 12.9 % (ref 11.5–15.5)
WBC Count: 4.9 10*3/uL (ref 4.0–10.5)
nRBC: 0 % (ref 0.0–0.2)

## 2021-05-25 LAB — CMP (CANCER CENTER ONLY)
ALT: 59 U/L — ABNORMAL HIGH (ref 0–44)
AST: 39 U/L (ref 15–41)
Albumin: 4.3 g/dL (ref 3.5–5.0)
Alkaline Phosphatase: 59 U/L (ref 38–126)
Anion gap: 7 (ref 5–15)
BUN: 13 mg/dL (ref 6–20)
CO2: 27 mmol/L (ref 22–32)
Calcium: 9.6 mg/dL (ref 8.9–10.3)
Chloride: 105 mmol/L (ref 98–111)
Creatinine: 1.15 mg/dL (ref 0.61–1.24)
GFR, Estimated: >60 mL/min (ref >60)
Glucose, Bld: 107 mg/dL — ABNORMAL HIGH (ref 70–99)
Potassium: 4.4 mmol/L (ref 3.5–5.1)
Sodium: 139 mmol/L (ref 135–145)
Total Bilirubin: 0.2 mg/dL — ABNORMAL LOW (ref 0.3–1.2)
Total Protein: 7.6 g/dL (ref 6.5–8.1)

## 2021-05-26 NOTE — Progress Notes (Signed)
[place] [phone]   [phone]  PROGRESS NOTE  Patient Care Team: Kerin Perna, NP as PCP - General (Internal Medicine) Donato Heinz, MD as PCP - Cardiology (Cardiology)  Hematological/Oncological History # Mild Thrombocytopenia 08/11/2020: WBC 4.7, Hgb 15.4, MCV 86.2, Plt 147 10/31/2020: WBC 4.9, Hgb 14.2, MCV 87.6, Plt 141 11/14/2020: WBC 4.3, Hgb 14.9, MCV 87, Plt 142 11/24/2020: establish care with Dr. Lorenso Courier   12/31/2020: WBC 5.5, Hgb 14.4, MCV 42.9, Plt 156 05/25/2021: WBC 4.9, Hgb 14.6, MCV 43.3, Plt 136  Interval History:  Terrence Baker 30 y.o. male with medical history significant for thrombocytopenia of unclear etiology who presents for a follow up visit. The patient's last visit was on 11/24/2020 as time he established care. In the interim since the last visit the patient had a bump in his LFTs and extensive imaging of his liver.  It was thought to be secondary to medication and since discontinuing his LFTs have normalized.  On exam Terrence Baker is accompanied by his mother via phone.  He reports that he has been "doing good" in the interim since his last visit.  He has been taking vitamin D3 and fish oil "constantly".  He notes he is discontinue mirtazapine as this was the cause of his liver enzyme increase.  He notes that he has been having some difficulty with feeling weak and poor sleep.  Fortunately he denies any bleeding, bruising, or dark stools.  Full 10 point ROS is listed below.  MEDICAL HISTORY:  Past Medical History:  Diagnosis Date   Anxiety    GERD (gastroesophageal reflux disease)    No pertinent past medical history     SURGICAL HISTORY: Past Surgical History:  Procedure Laterality Date   NO PAST SURGERIES      SOCIAL HISTORY: Social History   Socioeconomic History   Marital status: Single    Spouse name: Not on file   Number of children: Not on file   Years of education: Not on file   Highest  education level: Not on file  Occupational History   Not on file  Tobacco Use   Smoking status: Never   Smokeless tobacco: Never  Vaping Use   Vaping Use: Never used  Substance and Sexual Activity   Alcohol use: Never   Drug use: Never   Sexual activity: Not Currently  Other Topics Concern   Not on file  Social History Narrative   Not on file   Social Determinants of Health   Financial Resource Strain: Not on file  Food Insecurity: Not on file  Transportation Needs: Not on file  Physical Activity: Not on file  Stress: Not on file  Social Connections: Not on file  Intimate Partner Violence: Not on file    FAMILY HISTORY: Family History  Problem Relation Age of Onset   Diabetes Father     ALLERGIES:  has No Known Allergies.  MEDICATIONS:  Current Outpatient Medications  Medication Sig Dispense Refill   gabapentin (NEURONTIN) 100 MG capsule Take 1 capsule (100 mg total) by mouth 3 (three) times daily. 90 capsule 2   Lancets (FREESTYLE) lancets Use as instructed 100 each 12   sucralfate (CARAFATE) 1 g tablet Take 1 tablet (1 g total) by mouth 4 (four) times daily -  with meals and at bedtime for 14 days. (Patient not taking: Reported on 11/24/2020) 56 tablet 0   No current facility-administered medications for this visit.    REVIEW OF SYSTEMS:  Constitutional: ( - ) fevers, ( - )  chills , ( - ) night sweats Eyes: ( - ) blurriness of vision, ( - ) double vision, ( - ) watery eyes Ears, nose, mouth, throat, and face: ( - ) mucositis, ( - ) sore throat Respiratory: ( - ) cough, ( - ) dyspnea, ( - ) wheezes Cardiovascular: ( - ) palpitation, ( - ) chest discomfort, ( - ) lower extremity swelling Gastrointestinal:  ( - ) nausea, ( - ) heartburn, ( - ) change in bowel habits Skin: ( - ) abnormal skin rashes Lymphatics: ( - ) new lymphadenopathy, ( - ) easy bruising Neurological: ( - ) numbness, ( - ) tingling, ( - ) new weaknesses Behavioral/Psych: ( - ) mood change, (  - ) new changes  All other systems were reviewed with the patient and are negative.  PHYSICAL EXAMINATION:  Vitals:   05/25/21 1534  BP: 125/78  Pulse: 72  Resp: 18  Temp: (!) 97.2 F (36.2 C)  SpO2: 100%   Filed Weights   05/25/21 1534  Weight: 192 lb 8 oz (87.3 kg)    GENERAL: Well-appearing young African-American male, alert, no distress and comfortable SKIN: skin color, texture, turgor are normal, no rashes or significant lesions EYES: conjunctiva are pink and non-injected, sclera clear LUNGS: clear to auscultation and percussion with normal breathing effort HEART: regular rate & rhythm and no murmurs and no lower extremity edema Musculoskeletal: no cyanosis of digits and no clubbing  PSYCH: alert & oriented x 3, fluent speech NEURO: no focal motor/sensory deficits  LABORATORY DATA:  I have reviewed the data as listed CBC Latest Ref Rng & Units 05/25/2021 12/31/2020 11/24/2020  WBC 4.0 - 10.5 K/uL 4.9 5.5 3.3(L)  Hemoglobin 13.0 - 17.0 g/dL 14.6 14.4 15.1  Hematocrit 39.0 - 52.0 % 43.3 42.9 45.6  Platelets 150 - 400 K/uL 136(L) 156 132(L)    CMP Latest Ref Rng & Units 05/25/2021 04/17/2021 03/24/2021  Glucose 70 - 99 mg/dL 107(H) - 69  BUN 6 - 20 mg/dL 13 - 11  Creatinine 0.61 - 1.24 mg/dL 1.15 - 0.98  Sodium 135 - 145 mmol/L 139 - 143  Potassium 3.5 - 5.1 mmol/L 4.4 - 4.8  Chloride 98 - 111 mmol/L 105 - 102  CO2 22 - 32 mmol/L 27 - 24  Calcium 8.9 - 10.3 mg/dL 9.6 - 10.1  Total Protein 6.5 - 8.1 g/dL 7.6 6.4 7.3  Total Bilirubin 0.3 - 1.2 mg/dL 0.2(L) 0.4 0.2  Alkaline Phos 38 - 126 U/L 59 41(L) 58  AST 15 - 41 U/L 39 79(H) 85(H)  ALT 0 - 44 U/L 59(H) 156(H) 206(H)     RADIOGRAPHIC STUDIES: No results found.  ASSESSMENT & PLAN Terrence Baker 30 y.o. male with medical history significant for thrombocytopenia of unclear etiology who presents for a follow up visit.  After review of the labs, review of the records, and discussion with the patient the patients  findings are most consistent with a mild thrombocytopenia.  This is not associated with any other cytopenias at this time.  Given these findings it is not clear what is causing his mild lab abnormality.  Full work-up including viral serologies, initial panel, peripheral blood film review did not show any clear cause.  Additionally extensive imaging of the liver and abdomen did not show any evidence of liver disease or splenomegaly.  Given these findings I would recommend that we continue to observe.  If there is  found to be worsening thrombocytopenia or new cytopenias we could consider pursuing a bone marrow biopsy.  # Mild Thrombocytopenia of Unclear Etiology -- At this time full evaluation was negative for any clear etiology --Peripheral blood smear did not show any evidence of clumping and collection of platelets in citrate did not show any difference in number --Viral etiologies were ruled out with negative hepatitis B, C, HIV serologies --No evidence of nutritional deficiency on prior work-up --Recommend return to clinic in 6 months time with repeat clinic visit in 12 months time. --Strict return precautions for any worsening drop in platelets, other cytopenias, bleeding, bruising, or dark stools.  No orders of the defined types were placed in this encounter.   All questions were answered. The patient knows to call the clinic with any problems, questions or concerns.  A total of more than 30 minutes were spent on this encounter with face-to-face time and non-face-to-face time, including preparing to see the patient, ordering tests and/or medications, counseling the patient and coordination of care as outlined above.   Ledell Peoples, MD Department of Hematology/Oncology [place] [facility] Phone: [phone] Pager: [phone] Email: [email or web address]  05/26/2021 8:18 AM

## 2021-06-02 ENCOUNTER — Telehealth (HOSPITAL_COMMUNITY): Payer: Self-pay | Admitting: *Deleted

## 2021-06-02 NOTE — Telephone Encounter (Signed)
Call from patient, asking for me to take a message for Dr Doyne Keel, Message is for her to call him today or tomorrow as he would like to give her an update on him and the medicine. Will let her know.

## 2021-06-02 NOTE — Telephone Encounter (Signed)
Patient notes that since's starting the Gabapentin he feels a lot better. He notes at times he feels groggy but notes that he takes the medication at 2:00 am. Provider encouraged patient to take medications 3 times a day. He however notes that he is concerned because he will be flying home for Christmas.  Provider informed patient to utilize his hydroxyzine which she was agreeable to.  Provider informed patient that if hydroxyzine is not as effective he can take 2 gabapentin prior to getting on the plane.  He endorsed understanding and agreed.  No other concerns at this time.

## 2021-06-10 ENCOUNTER — Encounter: Payer: Self-pay | Admitting: Dietician

## 2021-06-10 ENCOUNTER — Encounter: Payer: Self-pay | Attending: Internal Medicine | Admitting: Dietician

## 2021-06-10 ENCOUNTER — Other Ambulatory Visit: Payer: Self-pay

## 2021-06-10 DIAGNOSIS — R7303 Prediabetes: Secondary | ICD-10-CM | POA: Insufficient documentation

## 2021-06-10 NOTE — Patient Instructions (Addendum)
Try to move your fat intake to the meals farthest away from your workout. Focus on your carbohydrate and protein intake around your workouts.   Use your "Eating Before Exercise" and "Eating For Recovery" handouts to plan meals/snack to optimize your workout.  Follow the Athletic Plate guidelines based on the intensity of your workout for the day.  If you are considering a supplement, Creatine monohydrate is research proven to increase muscular endurance.

## 2021-06-10 NOTE — Progress Notes (Signed)
Medical Nutrition Therapy  Appointment Start time:  1010  Appointment End time:  1040  Primary concerns today: Elevated Liver Enzymes Referral diagnosis: R73.09 Prediabetes Preferred learning style: No preference indicated Learning readiness: Contemplating   NUTRITION ASSESSMENT   Anthropometrics  Ht: 5'9" Wt: 188.3 lbs  Body mass index is 27.7 kg/m.   Clinical Medical Hx: GAD, MDD, Low platelets, Liver Disease Medications: Mirtazapine (Discontinued) Labs: A1c - 5.8, ALT - 79 (high), AST -156 (high), Alkaline Phosphatase - 41 (low) Notable Signs/Symptoms: Anxious disposition   Lifestyle & Dietary Hx Pt called mother during appointment to listen to appointment Pt reports working out more recently. Pt is interested in more sports nutrition. Pt has been trying to increase their calories and protein. Pt is supplementing with whey protein. Pt has been cooking more as well. Liver enzymes are improving since discontinuing Mirtazapine.  Body Composition Scale Date 12/24/20  02/02/2021  03/24/2021  04/28/2021  06/10/2021  Current Body Weight 170.3 lbs 180.0 188.3 lbs 189.6 lbs 187.6 lbs  Total Body Fat % 15.3 18.1 20.2 20.5  20.1  Visceral Fat 8 10 11 11 11   Fat-Free Mass % 84.6 81.8 79.7 79.4 79.8   Total Body Water % 65.6 62.8 60.7 60.4 60.8  Muscle-Mass lbs 31.9 33.8 35.3 35.6 35.2  BMI 24.9 26.3 27.5 27.7 27.4  Body Fat Displacement              Torso  lbs 16.1 20.1 23.6 24.0 23.3         Left Leg  lbs 3.2 4.0 4.7 4.8 4.6         Right Leg  lbs 3.2 4.0 4.7 4.8 4.6         Left Arm  lbs 1.6 2.0 2.3 2.4 2.3         Right Arm  lbs 1.6 2.0 2.3 2.4 2.3      Estimated daily fluid intake: 90 oz Supplements: Fish oil, D3, B-Complex Sleep: Better while medicated Stress / self-care: GAD, recurrent MDD Current average weekly physical activity: Goes to the gym 3-4 days, 60 minutes. Cardio/resistance  24-Hr Dietary Recall First Meal: Fruit smoothie, whey protein, greek yogurt,  milk Snack: Small bag of chips, pear, water Second Meal: Baked chicken thighs, rice, potatoes, cabbage Snack:  Third Meal:  Snack:  Beverages: water    NUTRITION DIAGNOSIS  NB-1.5 Disordered eating pattern As related to MDD and prediabetes.  As evidenced by A1c of 5.8, self reported weight loss of 10% UBW due to inability to eat resulting from MDD, and self reported tendency to skip meals and binge on sweets..   NUTRITION INTERVENTION  Nutrition education (E-1) on the following topics:  Educated patient on the pathophysiology of diabetes. This includes why our bodies need circulating blood sugar, the relationship between insulin and blood sugar, and the results of insulin resistance and/or pancreatic insufficiency on the development of diabetes. Educated patient on factors that contribute to elevation of blood sugars, such as stress, illness, injury,and food choices. Discussed the role that physical activity plays in lowering blood sugar. Educate patient on the three main macronutrients. Protein, fats, and carbohydrates. Discussed how each of these macronutrients affect blood sugar levels, especially carbohydrate, and the importance of eating a consistent amount of carbohydrate throughout the day.  Educated patient on the balanced plate eating model. Recommended lunch and dinner be 1/2 non-starchy vegetables, 1/4 starches, and 1/4 protein. Recommended breakfast be a balance of starch and protein with a piece of fruit. Discussed  with patient the importance of working towards hitting the proportions of the balanced plate consistently. Counseled patient on ways to begin recognizing each of the food groups from the balanced plate in their own meals, and how close they are to fitting the recommended proportions of the balanced plate. Educated patient on the nutritional value of each food group on the balanced plate model. Counseled patient on allowing themselves to be present in their emotions when they  consider emotional eating. Advised patient to evaluate whether the impulse to eat is hunger based, or emotionally driven.  Educated patient on appropriate nutrition guidelines for liver health. This includes a low fat diet, especially in saturated and trans fats, low concentrated sugars and SSB, lean proteins, and high fiber items like fruits, vegetables, legumes, and whole grains. Emphasized that patient continue their physical activity regiment and minimize weight gain. NEW: Educated patient on periodization of carbohydrates and protein related to their exercise. Educated patient on the role of macronutrients inn fueling and rebuilding their body.   Handouts Provided Include  Snack Time Balanced Plate Fiber List NEW: Eating Before Exercise, Eating For Recovery NEW: Easy, Moderate, Hard Training day Athlete's Plate  Learning Style & Readiness for Change Teaching method utilized: Visual & Auditory  Demonstrated degree of understanding via: Teach Back  Barriers to learning/adherence to lifestyle change: Recurrent MDD  Goals Established by Pt Try to move your fat intake to the meals farthest away from your workout. Focus on your carbohydrate and protein intake around your workouts.  Use your "Eating Before Exercise" and "Eating For Recovery" handouts to plan meals/snack to optimize your workout. Follow the Athletic Plate guidelines based on the intensity of your workout for the day. If you are considering a supplement, Creatine monohydrate is research proven to increase muscular endurance.  MONITORING & EVALUATION Dietary intake, weekly physical activity, and body composition PRN.  Next Steps  Patient is to call [facility] and set up follow up with RD.

## 2021-06-15 ENCOUNTER — Telehealth: Payer: Self-pay | Admitting: *Deleted

## 2021-06-15 NOTE — Telephone Encounter (Signed)
Received call from patient. He states he is not feeling well and wanted to have some "other " labs done. Asked pt to be specific about his not feeling well.  He states he has a sore throat, and sinus pressure. Advised that he needs to go to Urgent to be checked for flu, covid etc. Advised that we don't do that in this office.  Provided address for [facility] on [address]. He said he knew where that was and would go there.

## 2021-06-16 ENCOUNTER — Other Ambulatory Visit: Payer: Self-pay

## 2021-06-16 ENCOUNTER — Encounter: Payer: Self-pay | Admitting: Nurse Practitioner

## 2021-06-16 ENCOUNTER — Telehealth (INDEPENDENT_AMBULATORY_CARE_PROVIDER_SITE_OTHER): Payer: Self-pay | Admitting: Nurse Practitioner

## 2021-06-16 DIAGNOSIS — R6889 Other general symptoms and signs: Secondary | ICD-10-CM

## 2021-06-16 NOTE — Patient Instructions (Signed)
URI:  Stay well hydrated  Stay active  Deep breathing exercises  May take tylenol for fever or pain  May take mucinex twice daily  Will check respiratory panel and strep swab - will call with results   Follow up:  Follow up if needed

## 2021-06-16 NOTE — Progress Notes (Signed)
Virtual Visit via Telephone Note  I connected with Terrence Baker on 06/16/21 at 10:20 AM EST by telephone and verified that I am speaking with the correct person using two identifiers.  Location: Patient: home Provider: office   I discussed the limitations, risks, security and privacy concerns of performing an evaluation and management service by telephone and the availability of in person appointments. I also discussed with the patient that there may be a patient responsible charge related to this service. The patient expressed understanding and agreed to proceed.   History of Present Illness:  Patient presents today for sick visit through telephone visit.  Patient states that he has been having sore throat, congestion since yesterday.  He states that he may have a low-grade fever as well but has not checked his temperature.  Patient has been taking DayQuil and try to stay well-hydrated.  He states he would like to be tested for strep flu and COVID because he does have to go out of town this weekend and will be flying.  Patient will come by the office this afternoon for nasal and throat swab. Denies f/c/s, n/v/d, hemoptysis, PND, chest pain or edema.    Observations/Objective:  Vitals with BMI 06/10/2021 05/25/2021 04/28/2021  Height 5\' 9"  - 5\' 9"   Weight 187 lbs 10 oz 192 lbs 8 oz 189 lbs 10 oz  BMI 27.69 - 27.99  Systolic - 125 -  Diastolic - 78 -  Pulse - 72 -  Some encounter information is confidential and restricted. Go to Review Flowsheets activity to see all data.      Assessment and Plan:  URI:  Stay well hydrated  Stay active  Deep breathing exercises  May take tylenol for fever or pain  May take mucinex twice daily  Will check respiratory panel and strep swab - will call with results   Follow up:  Follow up if needed     I discussed the assessment and treatment plan with the patient. The patient was provided an opportunity to ask questions and all were  answered. The patient agreed with the plan and demonstrated an understanding of the instructions.   The patient was advised to call back or seek an in-person evaluation if the symptoms worsen or if the condition fails to improve as anticipated.  I provided 23 minutes of non-face-to-face time during this encounter.   , NP

## 2021-06-18 ENCOUNTER — Other Ambulatory Visit: Payer: Self-pay

## 2021-06-18 ENCOUNTER — Ambulatory Visit
Admission: EM | Admit: 2021-06-18 | Discharge: 2021-06-18 | Disposition: A | Discharge status: Home / Self Care | Payer: Self-pay | Attending: Physician Assistant | Admitting: Physician Assistant

## 2021-06-18 ENCOUNTER — Encounter: Payer: Self-pay | Admitting: Emergency Medicine

## 2021-06-18 DIAGNOSIS — J069 Acute upper respiratory infection, unspecified: Secondary | ICD-10-CM | POA: Insufficient documentation

## 2021-06-18 LAB — POCT RAPID STREP A (OFFICE): Rapid Strep A Screen: NEGATIVE

## 2021-06-18 MED ORDER — OSELTAMIVIR PHOSPHATE 75 MG PO CAPS: 75.0000 mg | ORAL_CAPSULE | Freq: Two times a day (BID) | ORAL | 0 refills | Status: AC

## 2021-06-18 MED ORDER — BENZONATATE 100 MG PO CAPS: 100.0000 mg | ORAL_CAPSULE | Freq: Three times a day (TID) | ORAL | 0 refills | Status: AC

## 2021-06-18 NOTE — ED Triage Notes (Signed)
Patient c/o sore throat, body aches, nasal congestion, non-productive cough x 4 days.  Patient has taken Advil, Dayquil and vitamins.  Patient is vaccinated for COVID.

## 2021-06-18 NOTE — Addendum Note (Signed)
Addended by: Margorie John on: 06/18/2021 02:57 PM   Modules accepted: Orders

## 2021-06-18 NOTE — ED Provider Notes (Signed)
[facility]    CSN: [id number] Arrival date & time: 06/18/21  1447      History   Chief Complaint Chief Complaint  Patient presents with   Sore Throat    HPI Terrence Baker is a 30 y.o. male.   Patient here today for evaluation of sore throat, body aches, congestion and non-productive cough that started 4 days ago.  He has taken multiple over-the-counter medications without significant relief.  The history is provided by the patient.  Sore Throat Pertinent negatives include no abdominal pain and no shortness of breath.   Past Medical History:  Diagnosis Date   Anxiety    GERD (gastroesophageal reflux disease)    No pertinent past medical history     Patient Active Problem List   Diagnosis Date Noted   Cough 02/11/2021   Thrombocytopenia (HCC) 02/11/2021   Prediabetes 02/11/2021   Severe episode of recurrent major depressive disorder, without psychotic features (HCC) 10/08/2020   Generalized anxiety disorder 09/10/2020   Major depressive disorder, single episode, moderate (HCC) 09/10/2020    Past Surgical History:  Procedure Laterality Date   NO PAST SURGERIES         Home Medications    Prior to Admission medications   Medication Sig Start Date End Date Taking? Authorizing Provider  gabapentin (NEURONTIN) 100 MG capsule Take 1 capsule (100 mg total) by mouth 3 (three) times daily. 03/27/21  Yes Toy Cookey E, NP  oseltamivir (TAMIFLU) 75 MG capsule Take 1 capsule (75 mg total) by mouth every 12 (twelve) hours. 06/18/21  Yes Tomi Bamberger, PA-C  Lancets (FREESTYLE) lancets Use as instructed 11/19/20   Rema Fendt, NP  sucralfate (CARAFATE) 1 g tablet Take 1 tablet (1 g total) by mouth 4 (four) times daily -  with meals and at bedtime for 14 days. Patient not taking: Reported on 11/24/2020 11/12/20 11/26/20  Gerhard Munch, MD    Family History Family History  Problem Relation Age of Onset   Diabetes Father     Social  History Social History   Tobacco Use   Smoking status: Never   Smokeless tobacco: Never  Vaping Use   Vaping Use: Never used  Substance Use Topics   Alcohol use: Never   Drug use: Never     Allergies   Patient has no known allergies.   Review of Systems Review of Systems  Constitutional:  Positive for chills and fever (subjective).  HENT:  Positive for congestion and sore throat. Negative for ear pain.   Eyes:  Negative for discharge and redness.  Respiratory:  Positive for cough. Negative for shortness of breath.   Gastrointestinal:  Negative for abdominal pain, nausea and vomiting.  Musculoskeletal:  Positive for myalgias.    Physical Exam Triage Vital Signs ED Triage Vitals [06/18/21 1540]  Enc Vitals Group     BP 113/69     Pulse Rate 82     Resp      Temp 98.2 F (36.8 C)     Temp Source Oral     SpO2 98 %     Weight 180 lb (81.6 kg)     Height 5\' 9"  (1.753 m)     Head Circumference      Peak Flow      Pain Score 6     Pain Loc      Pain Edu?      Excl. in GC?    No data found.  Updated Vital Signs BP  113/69 (BP Location: Left Arm)    Pulse 82    Temp 98.2 F (36.8 C) (Oral)    Ht 5\' 9"  (1.753 m)    Wt 180 lb (81.6 kg)    SpO2 98%    BMI 26.58 kg/m      Physical Exam Vitals and nursing note reviewed.  Constitutional:      General: He is not in acute distress.    Appearance: Normal appearance. He is not ill-appearing.  HENT:     Head: Normocephalic and atraumatic.     Nose: Congestion present.     Mouth/Throat:     Mouth: Mucous membranes are moist.     Pharynx: Oropharynx is clear. No oropharyngeal exudate or posterior oropharyngeal erythema.  Eyes:     Conjunctiva/sclera: Conjunctivae normal.  Cardiovascular:     Rate and Rhythm: Normal rate and regular rhythm.     Heart sounds: Normal heart sounds. No murmur heard. Pulmonary:     Effort: Pulmonary effort is normal. No respiratory distress.     Breath sounds: Normal breath sounds. No  wheezing, rhonchi or rales.  Skin:    General: Skin is warm and dry.  Neurological:     Mental Status: He is alert.  Psychiatric:        Mood and Affect: Mood normal.        Thought Content: Thought content normal.     UC Treatments / Results  Labs (all labs ordered are listed, but only abnormal results are displayed) Labs Reviewed - No data to display  EKG   Radiology No results found.  Procedures Procedures (including critical care time)  Medications Ordered in UC Medications - No data to display  Initial Impression / Assessment and Plan / UC Course  I have reviewed the triage vital signs and the nursing notes.  Pertinent labs & imaging results that were available during my care of the patient were reviewed by me and considered in my medical decision making (see chart for details).    Suspect likely viral etiology of symptoms. Flu and covid screening ordered earlier by PCP.  Tamiflu prescribed in the interim and recommended symptomatic treatment.  Encouraged follow-up with any further concerns.  Final Clinical Impressions(s) / UC Diagnoses   Final diagnoses:  Acute upper respiratory infection   Discharge Instructions   None    ED Prescriptions     Medication Sig Dispense Auth. Provider   oseltamivir (TAMIFLU) 75 MG capsule Take 1 capsule (75 mg total) by mouth every 12 (twelve) hours. 10 capsule , PA-C      PDMP not reviewed this encounter.   Tomi Bamberger, PA-C 06/18/21 1646

## 2021-06-19 LAB — COVID-19, FLU A+B AND RSV
Influenza A, NAA: NOT DETECTED
Influenza B, NAA: NOT DETECTED
RSV, NAA: NOT DETECTED
SARS-CoV-2, NAA: NOT DETECTED

## 2021-06-21 LAB — CULTURE, GROUP A STREP (THRC)

## 2021-07-15 ENCOUNTER — Ambulatory Visit (INDEPENDENT_AMBULATORY_CARE_PROVIDER_SITE_OTHER): Payer: No Payment, Other | Admitting: Licensed Clinical Social Worker

## 2021-07-15 DIAGNOSIS — F411 Generalized anxiety disorder: Secondary | ICD-10-CM

## 2021-07-16 ENCOUNTER — Telehealth (HOSPITAL_COMMUNITY): Payer: Self-pay | Admitting: Licensed Clinical Social Worker
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
## 2021-07-27 ENCOUNTER — Encounter: Payer: Self-pay | Attending: Internal Medicine | Admitting: Dietician

## 2021-07-27 ENCOUNTER — Ambulatory Visit: Payer: Self-pay | Admitting: Dietician

## 2021-07-27 ENCOUNTER — Encounter: Payer: Self-pay | Admitting: Dietician

## 2021-07-27 DIAGNOSIS — R7303 Prediabetes: Secondary | ICD-10-CM | POA: Insufficient documentation

## 2021-07-27 NOTE — Patient Instructions (Addendum)
Work on having something to eat about every 3 hours. Be sure to have 20-30 grams of protein at each meal.   Consider having a casein supplement an hour before bed to maximize your body's ability to recover.  If choosing a creatine, look for Creatine Monohydrate. Consider starting a loading phase of taking 4 servings (20g) for 5-7 days. Then, have 1 serving (5g) in your post workout drink.  Begin to move back to having your fruit and cool-whip for a sweet snack.  If exercising for more than 60 minutes, have 15-30 g of fast digesting carbs (fruit snacks, Gatorade, etc) around 50-60 minutes into your workout.  Increase your water intake to a minimum of 64 oz per day. Add another bottle after exercise.

## 2021-07-27 NOTE — Progress Notes (Signed)
Medical Nutrition Therapy  Appointment Start time:  1600  Appointment End time:  1640  Primary concerns today: Sports Nutrition Referral diagnosis: R73.09 Prediabetes Preferred learning style: No preference indicated Learning readiness: Contemplating   NUTRITION ASSESSMENT   Anthropometrics  Ht: 5'9" Wt: 188.4 lbs  Body mass index is 27.82 kg/m.   Clinical Medical Hx: GAD, MDD, Low platelets, Liver Disease Medications: Mirtazapine (Discontinued) Labs: A1c - 5.8, ALT - 79 (high), AST -156 (high), Alkaline Phosphatase - 41 (low) Notable Signs/Symptoms: Anxious disposition   Lifestyle & Dietary Hx Visit was conducted on the MyChart virtual platform. Pt mother was present during appointment. Pt reports working with a Proofreader, has been doing more strength and conditioning. Pt has a goal of cutting down their body fat to 10%. Pt reports their trainer suggests intermittent fasting. Pt wants to know what kind of supplements might be helpful to support their training. Pt reports having a little more sweets than usual recently, but is getting back to moderating them. Pt drink~ 3 bottles of water per day, reports breaking a good sweat when exercising.  Body Composition Scale Date 12/24/20  02/02/2021  03/24/2021  04/28/2021  06/10/2021 07/23/2021 (Conducted outside of [facility])  Current Body Weight 170.3 lbs 180.0 188.3 lbs 189.6 lbs 187.6 lbs 188.4 lbs  Total Body Fat % 15.3 18.1 20.2 20.5  20.1 15.0 %  Visceral Fat 8 10 11 11 11 9   Fat-Free Mass % 84.6 81.8 79.7 79.4 79.8 85.0%   Total Body Water % 65.6 62.8 60.7 60.4 60.8 61.4  Muscle-Mass lbs 31.9 33.8 35.3 35.6 35.2   BMI 24.9 26.3 27.5 27.7 27.4 27.0  Body Fat Displacement               Torso  lbs 16.1 20.1 23.6 24.0 23.3          Left Leg  lbs 3.2 4.0 4.7 4.8 4.6          Right Leg  lbs 3.2 4.0 4.7 4.8 4.6          Left Arm  lbs 1.6 2.0 2.3 2.4 2.3          Right Arm  lbs 1.6 2.0 2.3 2.4 2.3       Estimated daily fluid  intake: 90 oz Supplements: Fish oil, D3, B-Complex Sleep: Better while medicated Stress / self-care: GAD, recurrent MDD Current average weekly physical activity: Goes to the gym 3-4 days, 60 minutes. Cardio/resistance  24-Hr Dietary Recall First Meal:  Snack:  Second Meal:  Snack:  Third Meal:  Snack:  Beverages: water    NUTRITION DIAGNOSIS  NB-1.5 Disordered eating pattern As related to MDD and prediabetes.  As evidenced by A1c of 5.8, self reported weight loss of 10% UBW due to inability to eat resulting from MDD, and self reported tendency to skip meals and binge on sweets..   NUTRITION INTERVENTION  Nutrition education (E-1) on the following topics:  Educated patient on the pathophysiology of diabetes. This includes why our bodies need circulating blood sugar, the relationship between insulin and blood sugar, and the results of insulin resistance and/or pancreatic insufficiency on the development of diabetes. Educated patient on factors that contribute to elevation of blood sugars, such as stress, illness, injury,and food choices. Discussed the role that physical activity plays in lowering blood sugar. Educate patient on the three main macronutrients. Protein, fats, and carbohydrates. Discussed how each of these macronutrients affect blood sugar levels, especially carbohydrate, and the importance of eating  a consistent amount of carbohydrate throughout the day.  Educated patient on the balanced plate eating model. Recommended lunch and dinner be 1/2 non-starchy vegetables, 1/4 starches, and 1/4 protein. Recommended breakfast be a balance of starch and protein with a piece of fruit. Discussed with patient the importance of working towards hitting the proportions of the balanced plate consistently. Counseled patient on ways to begin recognizing each of the food groups from the balanced plate in their own meals, and how close they are to fitting the recommended proportions of the balanced  plate. Educated patient on the nutritional value of each food group on the balanced plate model. Counseled patient on allowing themselves to be present in their emotions when they consider emotional eating. Advised patient to evaluate whether the impulse to eat is hunger based, or emotionally driven.  Educated patient on appropriate nutrition guidelines for liver health. This includes a low fat diet, especially in saturated and trans fats, low concentrated sugars and SSB, lean proteins, and high fiber items like fruits, vegetables, legumes, and whole grains. Emphasized that patient continue their physical activity regiment and minimize weight gain. NEW: Educated patient on periodization of carbohydrates and protein related to their exercise. Educated patient on the role of macronutrients inn fueling and rebuilding their body.   Handouts Provided Include  Snack Time Balanced Plate Fiber List Eating Before Exercise, Eating For Recovery Easy, Moderate, Hard Training day Athlete's Plate  Learning Style & Readiness for Change Teaching method utilized: Visual & Auditory  Demonstrated degree of understanding via: Teach Back  Barriers to learning/adherence to lifestyle change: Recurrent MDD  Goals Established by Pt Work on having something to eat about every 3 hours. Be sure to have 20-30 grams of protein at each meal.  Consider having a casein supplement an hour before bed to maximize your body's ability to recover. If choosing a creatine, look for Creatine Monohydrate. Consider starting a loading phase of taking 4 servings (20g) for 5-7 days. Then, have 1 serving (5g) in your post workout drink. Begin to move back to having your fruit and cool-whip for a sweet snack. If exercising for more than 60 minutes, have 15-30 g of fast digesting carbs (fruit snacks, Gatorade, etc) around 50-60 minutes into your workout. Increase your water intake to a minimum of 64 oz per day. Add another bottle after  exercise.  MONITORING & EVALUATION Dietary intake, weekly physical activity, and body composition in 1 month.  Next Steps  Patient is to follow up with RD virtually.

## 2021-08-31 ENCOUNTER — Encounter: Payer: Self-pay | Attending: Internal Medicine | Admitting: Dietician

## 2021-08-31 DIAGNOSIS — R7303 Prediabetes: Secondary | ICD-10-CM | POA: Insufficient documentation

## 2021-09-09 ENCOUNTER — Telehealth: Payer: Self-pay | Admitting: Dietician

## 2021-09-15 ENCOUNTER — Encounter (HOSPITAL_COMMUNITY): Payer: Self-pay

## 2021-09-15 ENCOUNTER — Ambulatory Visit (HOSPITAL_COMMUNITY): Payer: No Payment, Other | Admitting: Licensed Clinical Social Worker

## 2021-09-15 ENCOUNTER — Telehealth (HOSPITAL_COMMUNITY): Payer: Self-pay | Admitting: Licensed Clinical Social Worker

## 2021-09-15 NOTE — Telephone Encounter (Signed)
LCSW sent text message with link for video session per schedule. LCSW remained online available for session until 11:12am. Pt failed to sign on for session. ?

## 2021-09-22 NOTE — Progress Notes (Deleted)
?Cardiology Office Note:   ? ?Date:  09/22/2021  ? ?ID:  Terrence Baker, DOB [birth year], MRN [id number] ? ?PCP:  Kerin Perna, NP  ?Cardiologist:  Donato Heinz, MD  ?Electrophysiologist:  None  ? ?Referring MD: Kerin Perna, NP  ? ?No chief complaint on file. ? ? ?History of Present Illness:   ? ?Terrence Baker is a 31 y.o. male with a hx of anxiety who presents for follow-up.  He was referred by Gypsy Balsam, NP for evaluation of chest pain, initially seen on 11/05/2020.  He reports that his chest pain started months ago.  Describes as left-sided burning pain, but can also sometimes be a pinching pain.  Typically occurs when he is anxious and during panic attacks.  Has not traditionally noticed it with exertion but reports last week was doing high intensity workout and started having burning sensation in his chest.  Also reports he has been waking up short of breath.  Has been referred for sleep study.  He denies any lightheadedness, syncope, or lower extremity edema.  Does report that he has been having palpitations where he feels like heart is racing, typically occurs during panic attacks.  Has been having about once per week.  No smoking history.  Family history includes maternal grandmother has mitral valve prolapse and CHF and his 36 year old sister was recently referred to cardiologist for chest pain evaluation. ? ?ETT on 11/13/2020 showed excellent exercise capacity (13.6 METS), no evidence of ischemia. ? ?Since last clinic visit, ? ? ?Past Medical History:  ?Diagnosis Date  ? Anxiety   ? GERD (gastroesophageal reflux disease)   ? No pertinent past medical history   ? ? ?Past Surgical History:  ?Procedure Laterality Date  ? NO PAST SURGERIES    ? ? ?Current Medications: ?No outpatient medications have been marked as taking for the 09/23/21 encounter (Appointment) with Donato Heinz, MD.  ?  ? ?Allergies:   Patient has no known allergies.  ? ?Social History  ? ?Socioeconomic History  ?  Marital status: Single  ?  Spouse name: Not on file  ? Number of children: Not on file  ? Years of education: Not on file  ? Highest education level: Not on file  ?Occupational History  ? Not on file  ?Tobacco Use  ? Smoking status: Never  ? Smokeless tobacco: Never  ?Vaping Use  ? Vaping Use: Never used  ?Substance and Sexual Activity  ? Alcohol use: Never  ? Drug use: Never  ? Sexual activity: Not Currently  ?Other Topics Concern  ? Not on file  ?Social History Narrative  ? Not on file  ? ?Social Determinants of Health  ? ?Financial Resource Strain: Not on file  ?Food Insecurity: Not on file  ?Transportation Needs: Not on file  ?Physical Activity: Not on file  ?Stress: Not on file  ?Social Connections: Not on file  ?  ? ?Family History: ?The patient's family history includes Diabetes in his father. ? ?ROS:   ?Please see the history of present illness.    ?All other systems reviewed and are negative. ? ?EKGs/Labs/Other Studies Reviewed:   ? ?The following studies were reviewed today: ? ? ?EKG:  EKG is  ordered today.  The ekg ordered today demonstrates normal sinus rhythm, rate 67, diffuse concave ST elevations consistent with early repolarization ? ?Recent Labs: ?05/25/2021: ALT 59; BUN 13; Creatinine 1.15; Hemoglobin 14.6; Platelet Count 136; Potassium 4.4; Sodium 139  ?Recent Lipid Panel ?   ?Component  Value Date/Time  ? CHOL 177 10/08/2020 1703  ? TRIG 54 10/08/2020 1703  ? HDL 57 10/08/2020 1703  ? CHOLHDL 3.1 10/08/2020 1703  ? LDLCALC 109 (H) 10/08/2020 1703  ? ? ?Physical Exam:   ? ?VS:  There were no vitals taken for this visit.   ? ?Wt Readings from Last 3 Encounters:  ?07/27/21 188 lb 6.4 oz (85.5 kg)  ?06/18/21 180 lb (81.6 kg)  ?06/10/21 187 lb 9.6 oz (85.1 kg)  ?  ? ?GEN:  Well nourished, well developed in no acute distress ?HEENT: Normal ?NECK: No JVD; No carotid bruits ?LYMPHATICS: No lymphadenopathy ?CARDIAC: RRR, no murmurs, rubs, gallops ?RESPIRATORY:  Clear to auscultation without rales,  wheezing or rhonchi  ?ABDOMEN: Soft, non-tender, non-distended ?MUSCULOSKELETAL:  No edema; No deformity  ?SKIN: Warm and dry ?NEUROLOGIC:  Alert and oriented x 3 ?PSYCHIATRIC:  Normal affect  ? ?ASSESSMENT:   ? ?No diagnosis found. ? ?PLAN:   ? ?Chest pain: Atypical in description, suspect due to anxiety/panic attacks.  ETT on 11/13/2020 showed excellent exercise capacity (13.6 METS), no evidence of ischemia.  Does have baseline mild concave ST elevations on EKG, consistent with early repolarization.  Description of pain is not consistent with pericarditis.  ESR/CRP unremarkable ? ?Palpitations: Suspect due to panic attacks.  Will check Zio patch x2 weeks to rule out arrhythmia. ? ?RTC in *** ? ? ? ? ? ?Medication Adjustments/Labs and Tests Ordered: ?Current medicines are reviewed at length with the patient today.  Concerns regarding medicines are outlined above.  ?No orders of the defined types were placed in this encounter. ? ?No orders of the defined types were placed in this encounter. ? ? ?There are no Patient Instructions on file for this visit.  ? ?Signed, ?Donato Heinz, MD  ?09/22/2021 2:15 PM    ?[place] ?

## 2021-09-23 ENCOUNTER — Ambulatory Visit: Payer: 59 | Admitting: Cardiology

## 2021-09-28 ENCOUNTER — Encounter: Payer: Self-pay | Admitting: Cardiology

## 2021-10-06 ENCOUNTER — Ambulatory Visit (HOSPITAL_COMMUNITY): Payer: Self-pay | Admitting: Licensed Clinical Social Worker

## 2021-10-07 ENCOUNTER — Encounter: Payer: Self-pay | Attending: Internal Medicine | Admitting: Dietician

## 2021-10-07 DIAGNOSIS — R7303 Prediabetes: Secondary | ICD-10-CM | POA: Insufficient documentation

## 2021-10-12 ENCOUNTER — Telehealth: Payer: Self-pay | Admitting: Cardiology

## 2021-10-12 NOTE — Telephone Encounter (Signed)
Spoke with pt after getting some return mail, Pt has moved. Updated address for pt. Pt also let me know he has now changed providers due to [place]. Pt is going to contact new doctor it receive all past medical records.  ?

## 2021-10-16 ENCOUNTER — Telehealth: Payer: Self-pay | Admitting: Hematology and Oncology

## 2021-10-16 NOTE — Telephone Encounter (Signed)
Called patient regarding upcoming appointment, left a voicemail. 

## 2021-10-27 ENCOUNTER — Ambulatory Visit (HOSPITAL_COMMUNITY): Payer: Self-pay | Admitting: Licensed Clinical Social Worker

## 2021-11-23 ENCOUNTER — Inpatient Hospital Stay: Payer: Self-pay | Attending: Primary Care

## 2022-05-24 ENCOUNTER — Other Ambulatory Visit: Payer: Self-pay | Admitting: Hematology and Oncology

## 2022-05-24 ENCOUNTER — Inpatient Hospital Stay: Payer: Self-pay | Admitting: Hematology and Oncology

## 2022-05-24 ENCOUNTER — Inpatient Hospital Stay: Payer: Self-pay | Attending: Primary Care

## 2022-05-24 DIAGNOSIS — D696 Thrombocytopenia, unspecified: Secondary | ICD-10-CM

## 2023-05-05 IMAGING — MR MR ABDOMEN WO/W CM
11 of 19 series · 23 of 48 positions shown · IV contrast (gadavist)
Comparison: Ultrasound on 03/31/2021

CLINICAL DATA: Possible hepatic mass on recent ultrasound.

EXAM:
MRI ABDOMEN WITHOUT AND WITH CONTRAST
TECHNIQUE: Multiplanar multisequence MR imaging of the abdomen was performed
both before and after the administration of intravenous contrast.
CONTRAST:  9mL GADAVIST GADOBUTROL 1 MMOL/ML IV SOLN

[Series 3: cor ssfse nav · coronal · 6.0mm · 0.70mm/px · 1 of 42 slices shown]
[im 1/42]
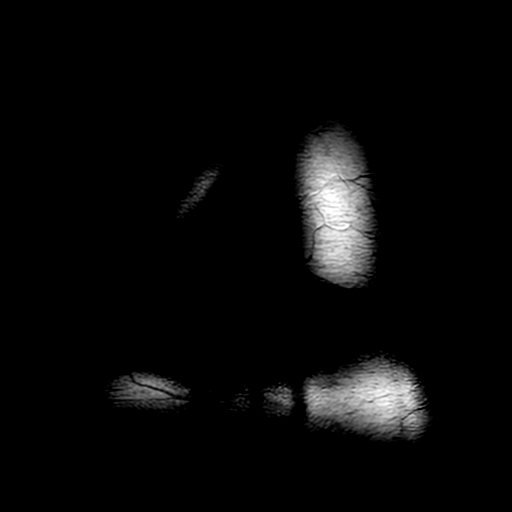

[Series 4: ax ssfse nav · axial · 6.0mm · 0.70mm/px · 1 of 52 slices shown]
[im 1/52]
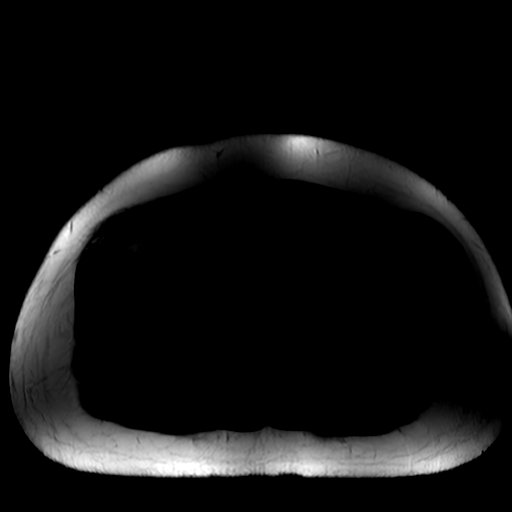

[Series 5: T2 fat-sat · axial · 6.0mm · 0.70mm/px · 1 of 45 slices shown]
[im 1/45]
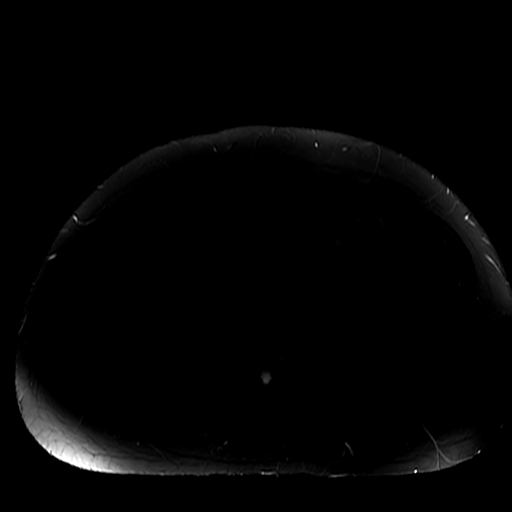

[Series 6: DWI b500 · axial · 8.0mm · 1.76mm/px · 1 of 64 slices shown]
[im 1/64]
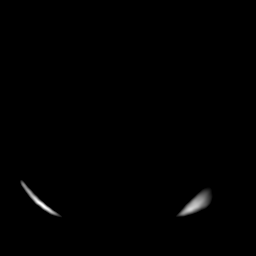

[Series 9: T1 dynamic · coronal · 3.3mm · 1.41mm/px · 4 of 148 slices shown]
[im 1/148]
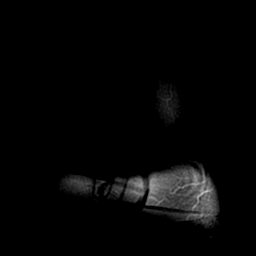
[im 50/148]
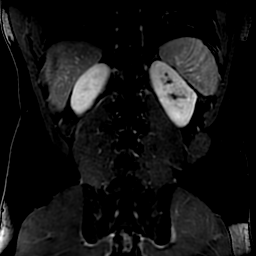
[im 99/148]
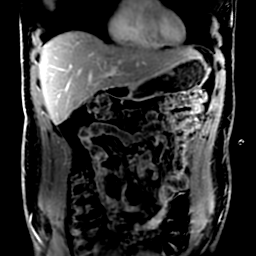
[im 148/148]
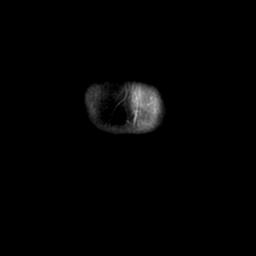

[Series 650: ADC · axial · 8.0mm · 1.76mm/px · 1 of 32 slices shown]
[im 1/32]
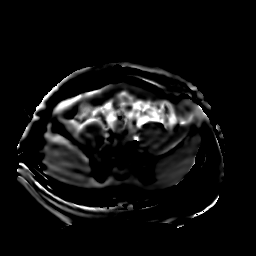

[Series 800: T1 dynamic post-contrast · axial · non-contrast · 4.0mm · 0.70mm/px · z∈[-0,+230]mm · 3 of 116 slices shown (1 of 5)]
[im 1/116]
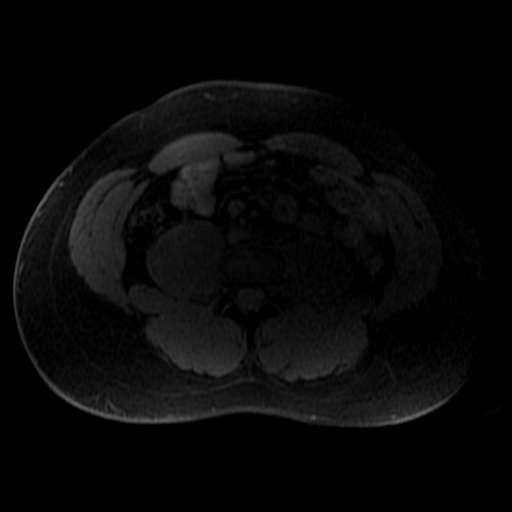
[im 58/116]
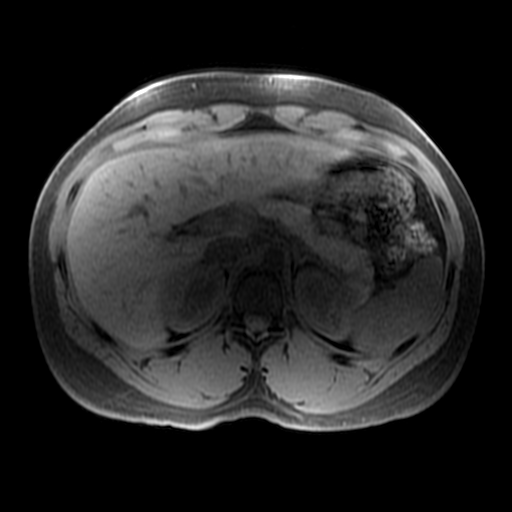
[im 116/116]
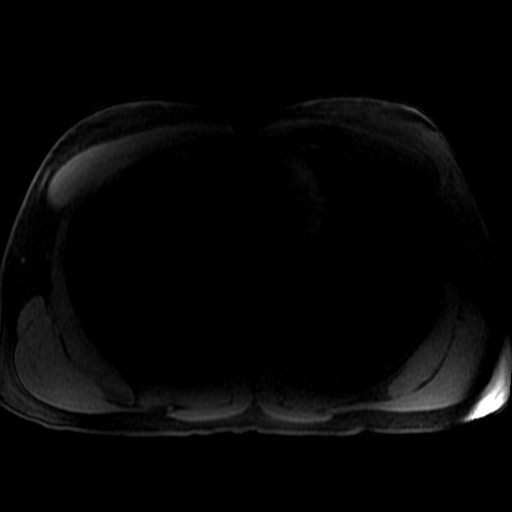

[Series 801: T1 dynamic post-contrast · axial · non-contrast · 4.0mm · 0.70mm/px · z∈[-0,+230]mm · 3 of 116 slices shown (2 of 5)]
[im 1/116]
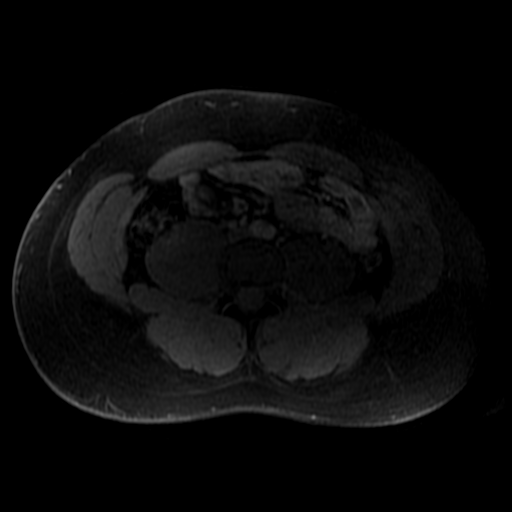
[im 58/116]
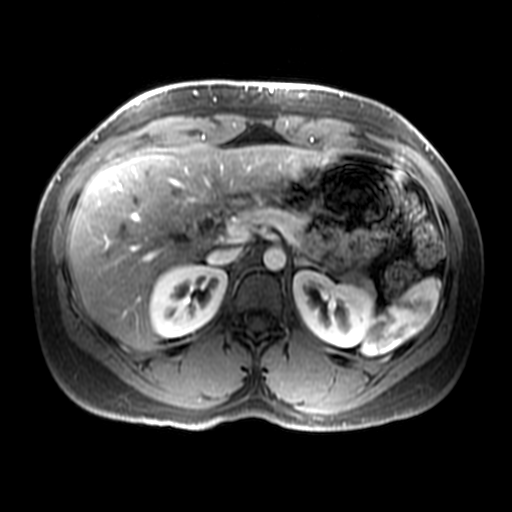
[im 116/116]
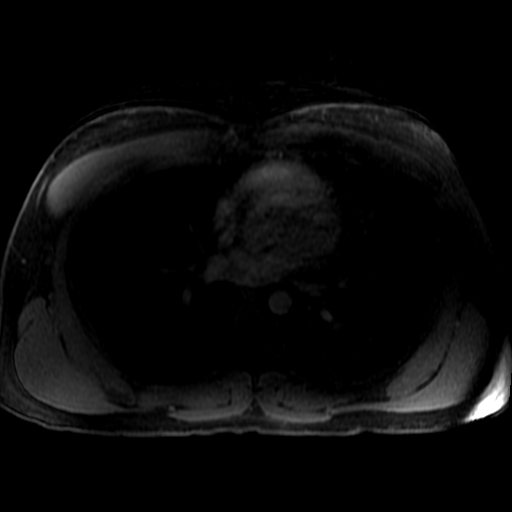

[Series 802: T1 dynamic post-contrast · axial · non-contrast · 4.0mm · 0.70mm/px · z∈[-0,+230]mm · 3 of 116 slices shown (3 of 5)]
[im 1/116]
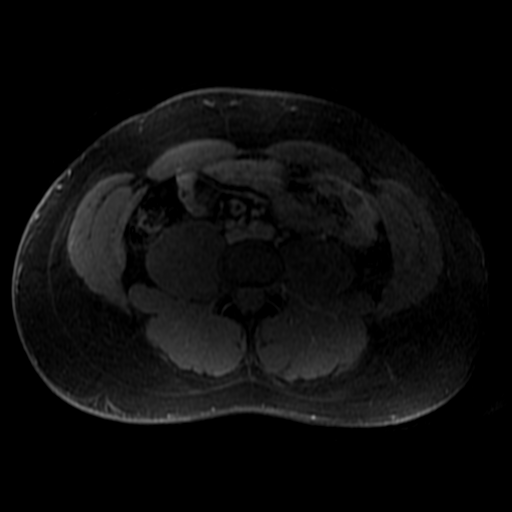
[im 58/116]
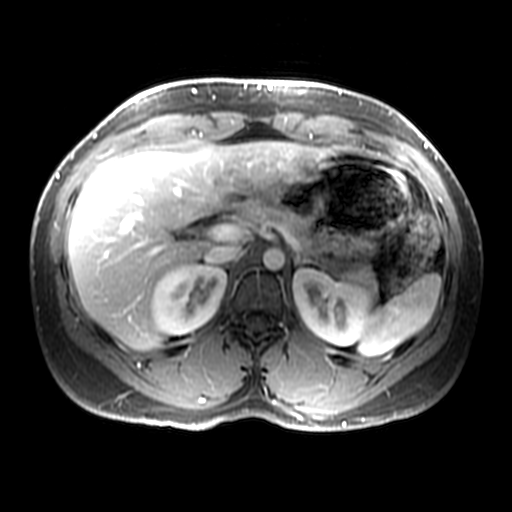
[im 116/116]
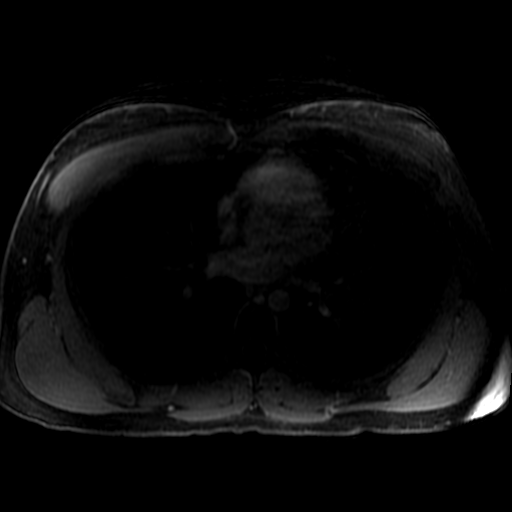

[Series 803: T1 dynamic post-contrast · axial · non-contrast · 4.0mm · 0.70mm/px · z∈[-0,+230]mm · 3 of 116 slices shown (4 of 5)]
[im 1/116]
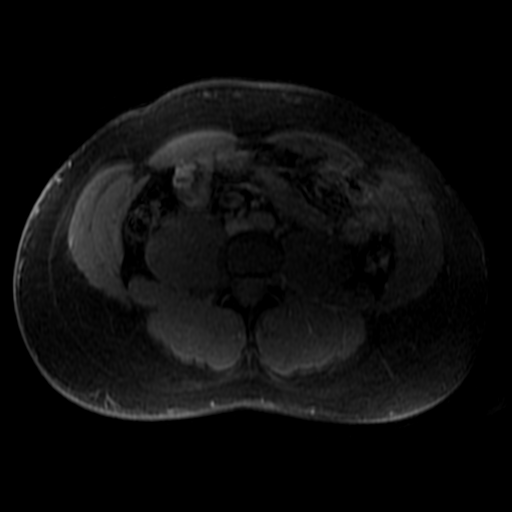
[im 58/116]
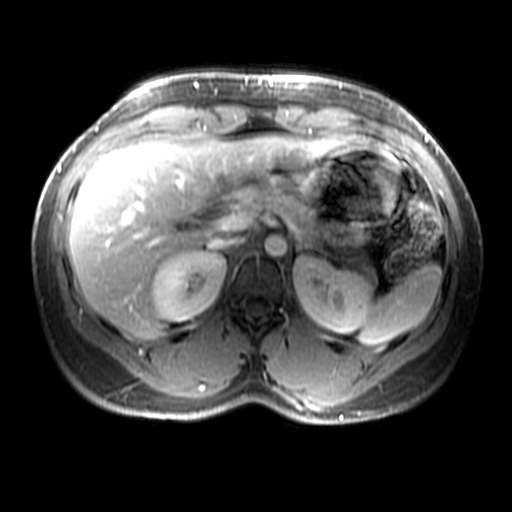
[im 116/116]
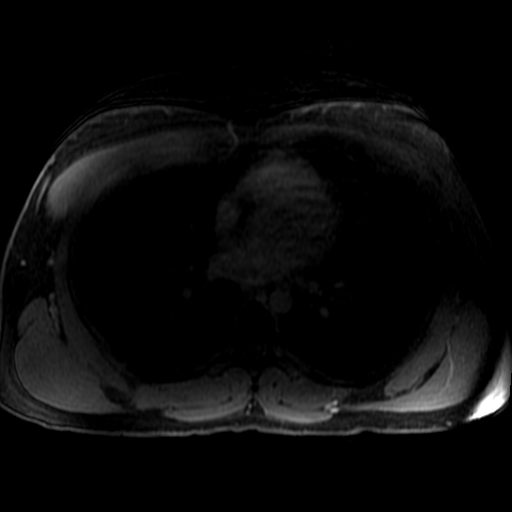

[Series 804: T1 dynamic post-contrast · axial · non-contrast · 4.0mm · 0.70mm/px · z∈[-0,+114]mm · 2 of 116 slices shown (5 of 5)]
[im 1/116]
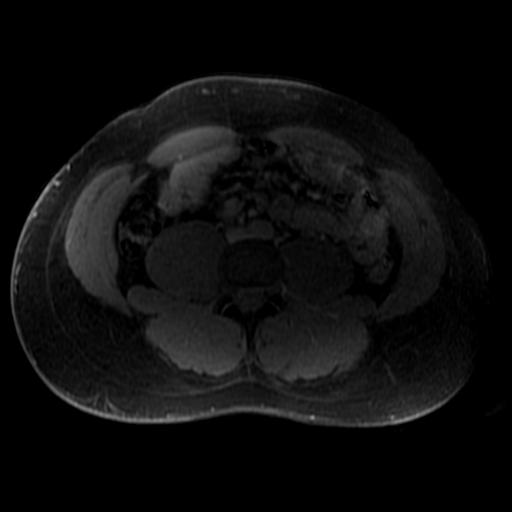
[im 58/116]
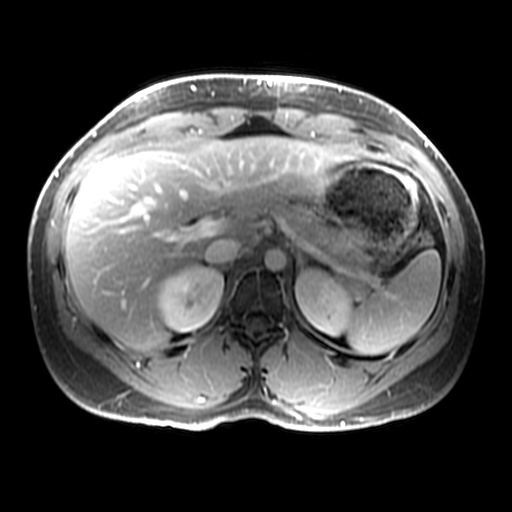

[23 of 48 positions shown; findings below may reference images not displayed]

FINDINGS: Lower chest: No acute findings.

Hepatobiliary: No hepatic lesions are visualized on any sequences,
including diffusion imaging. No evidence of steatosis. Gallbladder
is unremarkable. No evidence of biliary ductal dilatation.

Pancreas:  No mass or inflammatory changes.

Spleen:  Within normal limits in size and appearance.

Adrenals/Urinary Tract: No masses identified. No evidence of
hydronephrosis.

Stomach/Bowel: Visualized portion unremarkable.

Vascular/Lymphatic: No pathologically enlarged lymph nodes
identified. No acute vascular findings.

Other:  None.

Musculoskeletal:  No suspicious bone lesions identified.
IMPRESSION: Negative. No evidence of hepatic mass or other significant
abnormality.
